# Patient Record
Sex: Female | Born: 1995 | Race: White | Hispanic: No | Marital: Married | Smoking: Never smoker
Health system: Southern US, Community
[De-identification: ages and names within clinical notes are randomized; demographics above are authoritative.]

## PROBLEM LIST (undated history)

## (undated) DIAGNOSIS — R091 Pleurisy: Secondary | ICD-10-CM

## (undated) DIAGNOSIS — K219 Gastro-esophageal reflux disease without esophagitis: Secondary | ICD-10-CM

## (undated) DIAGNOSIS — F909 Attention-deficit hyperactivity disorder, unspecified type: Secondary | ICD-10-CM

## (undated) HISTORY — DX: Attention-deficit hyperactivity disorder, unspecified type: F90.9

## (undated) HISTORY — PX: WISDOM TOOTH EXTRACTION: SHX21

---
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
## 2017-08-17 ENCOUNTER — Ambulatory Visit (HOSPITAL_COMMUNITY)
Admission: EM | Admit: 2017-08-17 | Discharge: 2017-08-17 | Disposition: A | Discharge status: Other Acute Inpatient Hospital | Payer: Self-pay

## 2017-08-17 ENCOUNTER — Emergency Department (HOSPITAL_COMMUNITY)
Admission: EM | Admit: 2017-08-17 | Discharge: 2017-08-17 | Discharge status: Against Medical Advice | Payer: Medicaid Other

## 2018-08-14 LAB — HM PAP SMEAR

## 2018-12-19 ENCOUNTER — Other Ambulatory Visit: Payer: Self-pay | Admitting: Pediatrics

## 2018-12-19 MED ORDER — KETOCONAZOLE 2 % EX CREA: 1.0000 "application " | TOPICAL_CREAM | Freq: Two times a day (BID) | CUTANEOUS | 0 refills | Status: DC

## 2018-12-20 ENCOUNTER — Other Ambulatory Visit: Payer: Self-pay | Admitting: Pediatrics

## 2018-12-31 ENCOUNTER — Encounter (INDEPENDENT_AMBULATORY_CARE_PROVIDER_SITE_OTHER): Payer: Self-pay

## 2018-12-31 ENCOUNTER — Telehealth: Payer: Self-pay

## 2018-12-31 ENCOUNTER — Telehealth: Payer: Medicaid Other | Admitting: Physician Assistant

## 2018-12-31 DIAGNOSIS — Z20822 Contact with and (suspected) exposure to covid-19: Secondary | ICD-10-CM

## 2018-12-31 NOTE — Progress Notes (Signed)
I have spent 5 minutes in review of e-visit questionnaire, review and updating patient chart, medical decision making and response to patient.   Evoleht Hovatter Cody Maybell Misenheimer, PA-C    

## 2018-12-31 NOTE — Progress Notes (Signed)
E-Visit for Corona Virus Screening   Your current symptoms could be consistent with the coronavirus.  Many health care providers can now test patients at their office but not all are.  [facility] has multiple testing sites. For information on our COVID testing locations and hours go to [email or web address]  Please quarantine yourself while awaiting your test results.  We are enrolling you in our MyChart Home Montioring for COVID19 . Daily you will receive a questionnaire within the MyChart website. Our COVID 19 response team willl be monitoriing your responses daily.    COVID-19 is a respiratory illness with symptoms that are similar to the flu. Symptoms are typically mild to moderate, but there have been cases of severe illness and death due to the virus. The following symptoms may appear 2-14 days after exposure: . Fever . Cough . Shortness of breath or difficulty breathing . Chills . Repeated shaking with chills . Muscle pain . Headache . Sore throat . New loss of taste or smell . Fatigue . Congestion or runny nose . Nausea or vomiting . Diarrhea  It is vitally important that if you feel that you have an infection such as this virus or any other virus that you stay home and away from places where you may spread it to others.  You should self-quarantine for 14 days if you have symptoms that could potentially be coronavirus or have been in close contact a with a person diagnosed with COVID-19 within the last 2 weeks. You should avoid contact with people age 65 and older.   You should wear a mask or cloth face covering over your nose and mouth if you must be around other people or animals, including pets (even at home). Try to stay at least 6 feet away from other people. This will protect the people around you.  You may also take acetaminophen (Tylenol) as needed for fever.   Reduce your risk of any infection by using the same precautions used for avoiding the  common cold or flu:  . Wash your hands often with soap and warm water for at least 20 seconds.  If soap and water are not readily available, use an alcohol-based hand sanitizer with at least 60% alcohol.  . If coughing or sneezing, cover your mouth and nose by coughing or sneezing into the elbow areas of your shirt or coat, into a tissue or into your sleeve (not your hands). . Avoid shaking hands with others and consider head nods or verbal greetings only. . Avoid touching your eyes, nose, or mouth with unwashed hands.  . Avoid close contact with people who are sick. . Avoid places or events with large numbers of people in one location, like concerts or sporting events. . Carefully consider travel plans you have or are making. . If you are planning any travel outside or inside the [place], visit the CDC's Travelers' Health webpage for the latest health notices. . If you have some symptoms but not all symptoms, continue to monitor at home and seek medical attention if your symptoms worsen. . If you are having a medical emergency, call 911.  HOME CARE . Only take medications as instructed by your medical team. . Drink plenty of fluids and get plenty of rest. . A steam or ultrasonic humidifier can help if you have congestion.   GET HELP RIGHT AWAY IF YOU HAVE EMERGENCY WARNING SIGNS** FOR COVID-19. If you or someone is showing any of these signs seek emergency medical care immediately. Call   911 or proceed to your closest emergency facility if: . You develop worsening high fever. . Trouble breathing . Bluish lips or face . Persistent pain or pressure in the chest . New confusion . Inability to wake or stay awake . You cough up blood. . Your symptoms become more severe  **This list is not all possible symptoms. Contact your medical provider for any symptoms that are sever or concerning to you.   MAKE SURE YOU   Understand these instructions.  Will watch your condition.  Will get help right  away if you are not doing well or get worse.  Your e-visit answers were reviewed by a board certified advanced clinical practitioner to complete your personal care plan.  Depending on the condition, your plan could have included both over the counter or prescription medications.  If there is a problem please reply once you have received a response from your provider.  Your safety is important to us.  If you have drug allergies check your prescription carefully.    You can use MyChart to ask questions about today's visit, request a non-urgent call back, or ask for a work or school excuse for 24 hours related to this e-Visit. If it has been greater than 24 hours you will need to follow up with your provider, or enter a new e-Visit to address those concerns. You will get an e-mail in the next two days asking about your experience.  I hope that your e-visit has been valuable and will speed your recovery. Thank you for using e-visits.    

## 2018-12-31 NOTE — Telephone Encounter (Signed)
Per MyChart questionnaire, pt indicated that her cough and weakness have worsened today. Pt stated she has frequent coughing with SOB with activity. Pt is producing yellow green phlegm. Pt stated that she is not having wheezing. Pt was febrile to 101.3 but responded to Ibuprofen. Pt has been taking OTC Nyquil, Tylenol, and Mucinex Max.  Pt is able to walk without holding onto furniture. Advised pt that is she get weak to the point she is having to old on to things to keep her from falling to go to ED. Advised pt to go to ED or [facility]. Advised pt that if she is SOB at rest, has difficulty drawing breath in  Advised to call 911.

## 2019-01-01 ENCOUNTER — Other Ambulatory Visit: Payer: Self-pay

## 2019-01-01 ENCOUNTER — Encounter (INDEPENDENT_AMBULATORY_CARE_PROVIDER_SITE_OTHER): Payer: Self-pay

## 2019-01-01 ENCOUNTER — Ambulatory Visit
Admission: EM | Admit: 2019-01-01 | Discharge: 2019-01-01 | Disposition: A | Discharge status: Home / Self Care | Payer: No Typology Code available for payment source | Attending: Emergency Medicine | Admitting: Emergency Medicine

## 2019-01-01 DIAGNOSIS — R0981 Nasal congestion: Secondary | ICD-10-CM | POA: Diagnosis not present

## 2019-01-01 DIAGNOSIS — J029 Acute pharyngitis, unspecified: Secondary | ICD-10-CM

## 2019-01-01 DIAGNOSIS — Z20828 Contact with and (suspected) exposure to other viral communicable diseases: Secondary | ICD-10-CM

## 2019-01-01 DIAGNOSIS — R05 Cough: Secondary | ICD-10-CM

## 2019-01-01 DIAGNOSIS — Z20822 Contact with and (suspected) exposure to covid-19: Secondary | ICD-10-CM

## 2019-01-01 DIAGNOSIS — R6889 Other general symptoms and signs: Secondary | ICD-10-CM

## 2019-01-01 MED ORDER — BENZONATATE 100 MG PO CAPS: 100.0000 mg | ORAL_CAPSULE | Freq: Three times a day (TID) | ORAL | 0 refills | Status: DC

## 2019-01-01 MED ORDER — FLUTICASONE PROPIONATE 50 MCG/ACT NA SUSP: 2.0000 | Freq: Every day | NASAL | 0 refills | Status: DC

## 2019-01-01 MED ORDER — CETIRIZINE-PSEUDOEPHEDRINE ER 5-120 MG PO TB12: 1.0000 | ORAL_TABLET | Freq: Every day | ORAL | 0 refills | Status: DC

## 2019-01-01 MED ORDER — OSELTAMIVIR PHOSPHATE 75 MG PO CAPS: 75.0000 mg | ORAL_CAPSULE | Freq: Two times a day (BID) | ORAL | 0 refills | Status: DC

## 2019-01-01 NOTE — ED Triage Notes (Signed)
Pt presents to UC w/ c/o sore throat, body aches, congestion, fever, diarrhea, sob w/ activity x3 days. Pt states she did get a covid test 3 days ago which was negative.

## 2019-01-01 NOTE — ED Provider Notes (Signed)
[facility]   [id number] 01/01/19 Arrival Time: 1145   CC: Flu-like symptoms   SUBJECTIVE: History from: patient.  Patricia Kane is a 23 y.o. female who presents congestion, sore throat, mild productive cough with green sputum, fever, with tmax of 101.7, body aches, chills, fatigue, diarrhea, and SOB with exertion x 3 days.  Denies sick exposure to COVID, flu or strep.  Denies recent travel.  However, works at [employer].  Has tried OTC tylenol, ibuprofen, nyquil, and throat lozenges with relief.  Denies aggravating factors.  Denies previous symptoms in the past.   Denies wheezing, chest pain, nausea, vomiting, changes in bladder habits.    Had negative COVID test 3 days ago.    ROS: As per HPI.  All other pertinent ROS negative.     Past Medical History:  Diagnosis Date  . GERD (gastroesophageal reflux disease)   . Pleurisy    Past Surgical History:  Procedure Laterality Date  . WISDOM TOOTH EXTRACTION     Allergies  Allergen Reactions  . Amoxicillin Swelling   No current facility-administered medications on file prior to encounter.    Current Outpatient Medications on File Prior to Encounter  Medication Sig Dispense Refill  . ibuprofen (ADVIL,MOTRIN) 600 MG tablet Take 1 tablet (600 mg total) by mouth every 6 (six) hours as needed for pain. 20 tablet 0  . [DISCONTINUED] omeprazole (PRILOSEC) 20 MG capsule Take 1 capsule (20 mg total) by mouth daily. 30 capsule 0   Social History   Socioeconomic History  . Marital status: Single    Spouse name: Not on file  . Number of children: Not on file  . Years of education: Not on file  . Highest education level: Not on file  Occupational History  . Not on file  Social Needs  . Financial resource strain: Not on file  . Food insecurity    Worry: Not on file    Inability: Not on file  . Transportation needs    Medical: Not on file    Non-medical: Not on file  Tobacco Use  . Smoking status: Never  Smoker  . Smokeless tobacco: Never Used  Substance and Sexual Activity  . Alcohol use: No  . Drug use: No  . Sexual activity: Yes    Birth control/protection: Implant  Lifestyle  . Physical activity    Days per week: Not on file    Minutes per session: Not on file  . Stress: Not on file  Relationships  . Social Musician on phone: Not on file    Gets together: Not on file    Attends religious service: Not on file    Active member of club or organization: Not on file    Attends meetings of clubs or organizations: Not on file    Relationship status: Not on file  . Intimate partner violence    Fear of current or ex partner: Not on file    Emotionally abused: Not on file    Physically abused: Not on file    Forced sexual activity: Not on file  Other Topics Concern  . Not on file  Social History Narrative  . Not on file   Family History  Problem Relation Age of Onset  . Healthy Mother   . Healthy Father     OBJECTIVE:  Vitals:   01/01/19 1215  BP: 117/75  Pulse: 93  Resp: 16  Temp: 98.3 F (36.8 C)  TempSrc: Oral  SpO2: 96%     General appearance: alert; appears mildly fatigued, but nontoxic; speaking in full sentences and tolerating own secretions HEENT: NCAT; Ears: EACs clear, TMs pearly gray; Eyes: PERRL.  EOM grossly intact. Nose: nares patent without rhinorrhea, turbinates swollen and mildly erythematous, Throat: oropharynx clear, tonsils non erythematous or enlarged, uvula midline  Neck: supple without LAD Lungs: unlabored respirations, symmetrical air entry; cough: mild; no respiratory distress; CTAB Heart: regular rate and rhythm.  Radial pulses 2+ symmetrical bilaterally Abdomen: soft, nondistended, normal active bowel sounds; nontender to palpation; no guarding  Skin: warm and dry Psychological: alert and cooperative; normal mood and affect  ASSESSMENT & PLAN:  1. Suspected COVID-19 virus infection   2. Flu-like symptoms     Meds ordered  this encounter  Medications  . cetirizine-pseudoephedrine (ZYRTEC-D) 5-120 MG tablet    Sig: Take 1 tablet by mouth daily.    Dispense:  30 tablet    Refill:  0    Order Specific Question:   Supervising Provider    Answer:   Raylene Everts [[id number]]  . fluticasone (FLONASE) 50 MCG/ACT nasal spray    Sig: Place 2 sprays into both nostrils daily.    Dispense:  16 g    Refill:  0    Order Specific Question:   Supervising Provider    Answer:   Raylene Everts [[id number]]  . benzonatate (TESSALON) 100 MG capsule    Sig: Take 1 capsule (100 mg total) by mouth every 8 (eight) hours.    Dispense:  21 capsule    Refill:  0    Order Specific Question:   Supervising Provider    Answer:   Raylene Everts [[id number]]  . oseltamivir (TAMIFLU) 75 MG capsule    Sig: Take 1 capsule (75 mg total) by mouth every 12 (twelve) hours.    Dispense:  10 capsule    Refill:  0    Order Specific Question:   Supervising Provider    Answer:   Raylene Everts [[id number]]   Did not retest, due to patient having a negative COVID test 3 days ago  You should remain isolated in your home for 10 days from symptom onset AND greater than 72 hours after symptoms resolution (absence of fever without the use of fever-reducing medication and improvement in respiratory symptoms), whichever is longer Get plenty of rest and push fluids Tessalon Perles prescribed for cough Zyrtec-D prescribed for nasal congestion, runny nose, and/or sore throat Flonase prescribed for nasal congestion and runny nose Tamiflu prescribed.  Take as directed and to completion Use OTC medications like ibuprofen or tylenol as needed fever or pain Follow up with PCP by phone or e-visit for recheck and to ensure your symptoms are improving Call or go to the ED if you have any new or worsening symptoms such as fever, worsening cough, shortness of breath, chest tightness, chest pain, turning blue, changes in mental status, etc...   Reviewed  expectations re: course of current medical issues. Questions answered. Outlined signs and symptoms indicating need for more acute intervention. Patient verbalized understanding. After Visit Summary given.         Lestine Box, PA-C 01/01/19 1529

## 2019-01-01 NOTE — Discharge Instructions (Signed)
COVID testing ordered.  It will take between 5-7 days for test results.  Someone will contact you regarding abnormal results.    In the meantime: You should remain isolated in your home for 10 days from symptom onset AND greater than 72 hours after symptoms resolution (absence of fever without the use of fever-reducing medication and improvement in respiratory symptoms), whichever is longer Get plenty of rest and push fluids Tessalon Perles prescribed for cough Zyrtec-D prescribed for nasal congestion, runny nose, and/or sore throat Flonase prescribed for nasal congestion and runny nose Tamiflu prescribed.  Take as directed and to completion Use OTC medications like ibuprofen or tylenol as needed fever or pain Follow up with PCP by phone or e-visit for recheck and to ensure your symptoms are improving Call or go to the ED if you have any new or worsening symptoms such as fever, worsening cough, shortness of breath, chest tightness, chest pain, turning blue, changes in mental status, etc..Marland Kitchen

## 2019-01-02 ENCOUNTER — Encounter (INDEPENDENT_AMBULATORY_CARE_PROVIDER_SITE_OTHER): Payer: Self-pay

## 2019-01-05 ENCOUNTER — Encounter (INDEPENDENT_AMBULATORY_CARE_PROVIDER_SITE_OTHER): Payer: Self-pay

## 2019-03-12 ENCOUNTER — Ambulatory Visit: Payer: No Typology Code available for payment source | Attending: Internal Medicine

## 2019-03-12 ENCOUNTER — Other Ambulatory Visit: Payer: Self-pay

## 2019-03-12 DIAGNOSIS — Z20822 Contact with and (suspected) exposure to covid-19: Secondary | ICD-10-CM

## 2019-03-14 LAB — NOVEL CORONAVIRUS, NAA: SARS-CoV-2, NAA: NOT DETECTED

## 2019-06-11 ENCOUNTER — Telehealth: Payer: Self-pay

## 2019-06-11 NOTE — Telephone Encounter (Signed)
Patient has no PCP. She states she was referred by NP Teodora Medici. Would like to establish care with you. Please advise.

## 2019-06-18 NOTE — Telephone Encounter (Signed)
Patient is scheduled   

## 2019-06-18 NOTE — Telephone Encounter (Signed)
Ok to establish 

## 2019-06-29 ENCOUNTER — Ambulatory Visit (INDEPENDENT_AMBULATORY_CARE_PROVIDER_SITE_OTHER): Payer: No Typology Code available for payment source | Admitting: Family Medicine

## 2019-06-29 ENCOUNTER — Encounter: Payer: Self-pay | Admitting: Family Medicine

## 2019-06-29 ENCOUNTER — Other Ambulatory Visit: Payer: Self-pay

## 2019-06-29 DIAGNOSIS — E010 Iodine-deficiency related diffuse (endemic) goiter: Secondary | ICD-10-CM | POA: Diagnosis not present

## 2019-06-29 DIAGNOSIS — Z3041 Encounter for surveillance of contraceptive pills: Secondary | ICD-10-CM | POA: Insufficient documentation

## 2019-06-29 DIAGNOSIS — E669 Obesity, unspecified: Secondary | ICD-10-CM | POA: Insufficient documentation

## 2019-06-29 DIAGNOSIS — Z3009 Encounter for other general counseling and advice on contraception: Secondary | ICD-10-CM | POA: Insufficient documentation

## 2019-06-29 MED ORDER — LESSINA 0.1-20 MG-MCG PO TABS: 1.0000 | ORAL_TABLET | Freq: Every day | ORAL | 3 refills | Status: DC

## 2019-06-29 NOTE — Assessment & Plan Note (Signed)
New to provider, ongoing for pt.  Surprisingly, [place] in 2018 was normal.  She has hx of 'borderline' labs.  Will check today and determine if intervention is needed.  Pt expressed understanding and is in agreement w/ plan.

## 2019-06-29 NOTE — Patient Instructions (Signed)
We'll determine follow up based on the lab results We'll notify you of your lab results and make any changes if needed Continue to work on healthy diet and regular exercise- you can do it! We can consider starting phentermine once we know what the thyroid is doing Your birth control is available for pickup/mail at the pharmacy Call with any questions or concerns Welcome!  We're glad to have you!

## 2019-06-29 NOTE — Assessment & Plan Note (Signed)
Pt is doing well on OCPs and would like to continue.  UTD on pap.  Refill provided.

## 2019-06-29 NOTE — Assessment & Plan Note (Signed)
New to provider, ongoing for pt.  She is exercising regularly, working on portion control, has eliminated most sodas, and making healthier food choices and has still only lost 4 lbs since January.  Check thyroid and tx if needed.  If labs WNL, will start Phentermine to improve metabolism.  Pt expressed understanding and is in agreement w/ plan.

## 2019-06-29 NOTE — Progress Notes (Signed)
   Subjective:    Patient ID: Patricia Kane, female    DOB: [birth year], 24 y.o.   MRN: [id number]  HPI New to establish.  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  Thyromegaly- pt had 'borderline labs'.  Had US done but is not aware of results- Epic results from 2018 show 'essentially normal'.  Was supposed to see Endo but had issues w/ insurance.  No neck TTP.  Obesity- ongoing issue for pt.  Has started walking regularly, drinking up to a gallon of water daily. Down to 3-4 sodas/week rather than multiple each day.  Attempting to make healthy food choices and portion control.  Pt reports that despite intensive efforts since January has only lost 4 lbs.  Pt feels she has always had 'a slow metabolism'.  Would be interested in medication if possible.  Birth control- pt is interested in me assuming her OCP prescription   Review of Systems For ROS see HPI   This visit occurred during the SARS-CoV-2 public health emergency.  Safety protocols were in place, including screening questions prior to the visit, additional usage of staff PPE, and extensive cleaning of exam room while observing appropriate contact time as indicated for disinfecting solutions.       Objective:   Physical Exam Vitals reviewed.  Constitutional:      General: She is not in acute distress.    Appearance: She is well-developed. She is obese.  HENT:     Head: Normocephalic and atraumatic.  Eyes:     Conjunctiva/sclera: Conjunctivae normal.     Pupils: Pupils are equal, round, and reactive to light.  Neck:     Thyroid: No thyromegaly.     Comments: Thyromegaly present Cardiovascular:     Rate and Rhythm: Normal rate and regular rhythm.     Heart sounds: Normal heart sounds. No murmur.  Pulmonary:     Effort: Pulmonary effort is normal. No respiratory distress.     Breath sounds: Normal breath sounds.  Abdominal:     General: There is no distension.     Palpations: Abdomen is soft.     Tenderness: There is  no abdominal tenderness.  Musculoskeletal:     Cervical back: Normal range of motion and neck supple.  Lymphadenopathy:     Cervical: No cervical adenopathy.  Skin:    General: Skin is warm and dry.  Neurological:     Mental Status: She is alert and oriented to person, place, and time.  Psychiatric:        Behavior: Behavior normal.           Assessment & Plan:

## 2019-06-30 LAB — BASIC METABOLIC PANEL
BUN: 7 mg/dL (ref 7–25)
CO2: 25 mmol/L (ref 20–32)
Calcium: 9.8 mg/dL (ref 8.6–10.2)
Chloride: 104 mmol/L (ref 98–110)
Creat: 0.74 mg/dL (ref 0.50–1.10)
Glucose, Bld: 78 mg/dL (ref 65–99)
Potassium: 4.4 mmol/L (ref 3.5–5.3)
Sodium: 139 mmol/L (ref 135–146)

## 2019-06-30 LAB — CBC WITH DIFFERENTIAL/PLATELET
Absolute Monocytes: 474 cells/uL (ref 200–950)
Basophils Absolute: 41 cells/uL (ref 0–200)
Basophils Relative: 0.4 %
Eosinophils Absolute: 62 cells/uL (ref 15–500)
Eosinophils Relative: 0.6 %
HCT: 39.5 % (ref 35.0–45.0)
Hemoglobin: 13 g/dL (ref 11.7–15.5)
Lymphs Abs: 2802 cells/uL (ref 850–3900)
MCH: 29.4 pg (ref 27.0–33.0)
MCHC: 32.9 g/dL (ref 32.0–36.0)
MCV: 89.4 fL (ref 80.0–100.0)
MPV: 11.9 fL (ref 7.5–12.5)
Monocytes Relative: 4.6 %
Neutro Abs: 6922 cells/uL (ref 1500–7800)
Neutrophils Relative %: 67.2 %
Platelets: 273 10*3/uL (ref 140–400)
RBC: 4.42 10*6/uL (ref 3.80–5.10)
RDW: 12.7 % (ref 11.0–15.0)
Total Lymphocyte: 27.2 %
WBC: 10.3 10*3/uL (ref 3.8–10.8)

## 2019-06-30 LAB — HEPATIC FUNCTION PANEL
AG Ratio: 1.6 (calc) (ref 1.0–2.5)
ALT: 7 U/L (ref 6–29)
AST: 14 U/L (ref 10–30)
Albumin: 4.4 g/dL (ref 3.6–5.1)
Alkaline phosphatase (APISO): 60 U/L (ref 31–125)
Bilirubin, Direct: 0 mg/dL (ref 0.0–0.2)
Globulin: 2.8 g/dL (ref 1.9–3.7)
Indirect Bilirubin: 0.3 mg/dL (ref 0.2–1.2)
Total Bilirubin: 0.3 mg/dL (ref 0.2–1.2)
Total Protein: 7.2 g/dL (ref 6.1–8.1)

## 2019-06-30 LAB — LIPID PANEL
Cholesterol: 151 mg/dL (ref <200)
HDL: 44 mg/dL — ABNORMAL LOW (ref >=50)
LDL Cholesterol (Calc): 87 mg/dL (calc)
Non-HDL Cholesterol (Calc): 107 mg/dL (calc) (ref <130)
Total CHOL/HDL Ratio: 3.4 (calc) (ref <5.0)
Triglycerides: 104 mg/dL (ref <150)

## 2019-06-30 LAB — T4, FREE: Free T4: 1.2 ng/dL (ref 0.8–1.8)

## 2019-06-30 LAB — T3, FREE: T3, Free: 3 pg/mL (ref 2.3–4.2)

## 2019-06-30 LAB — TSH: TSH: 1.15 m[IU]/L

## 2019-07-02 ENCOUNTER — Encounter: Payer: Self-pay | Admitting: Family Medicine

## 2019-07-03 MED ORDER — PHENTERMINE HCL 37.5 MG PO CAPS: 37.5000 mg | ORAL_CAPSULE | ORAL | 0 refills | Status: DC

## 2019-07-27 ENCOUNTER — Encounter (INDEPENDENT_AMBULATORY_CARE_PROVIDER_SITE_OTHER): Payer: No Typology Code available for payment source | Admitting: Family Medicine

## 2019-07-27 DIAGNOSIS — B3731 Acute candidiasis of vulva and vagina: Secondary | ICD-10-CM

## 2019-07-27 DIAGNOSIS — B373 Candidiasis of vulva and vagina: Secondary | ICD-10-CM

## 2019-07-27 MED ORDER — FLUCONAZOLE 150 MG PO TABS: 150.0000 mg | ORAL_TABLET | Freq: Once | ORAL | 0 refills | Status: AC

## 2019-07-27 NOTE — Telephone Encounter (Signed)
Cumulative Time: 5 minutes Consent: Pt reached out via MyChart and gave consent People Involved: pt, CMA (Jess B), myself CC: yeast infection HPI: pt reports 5 days of vaginal redness, white discharge, itching.  No relief w/ OTC Miconazole.  She has a hx of yeast infxns and feels this is similar. AP: yeast vaginitis.  Start Diflucan.

## 2019-10-04 ENCOUNTER — Encounter: Payer: Self-pay | Admitting: Family Medicine

## 2019-10-05 ENCOUNTER — Encounter: Payer: Self-pay | Admitting: General Practice

## 2019-10-05 NOTE — Telephone Encounter (Signed)
This is only meant to be used for 3 months and then take a break so that your metabolism doesn't become dependent on it.  No refills at this time

## 2019-10-05 NOTE — Telephone Encounter (Signed)
Last OV 06/29/19 Phentermine last filled 07/03/19 #90 with 0

## 2019-10-11 ENCOUNTER — Telehealth: Payer: No Typology Code available for payment source | Admitting: Nurse Practitioner

## 2019-10-11 DIAGNOSIS — J069 Acute upper respiratory infection, unspecified: Secondary | ICD-10-CM | POA: Diagnosis not present

## 2019-10-11 MED ORDER — BENZONATATE 100 MG PO CAPS: 100.0000 mg | ORAL_CAPSULE | Freq: Three times a day (TID) | ORAL | 0 refills | Status: DC | PRN

## 2019-10-11 MED ORDER — FLUTICASONE PROPIONATE 50 MCG/ACT NA SUSP: 2.0000 | Freq: Every day | NASAL | 6 refills | Status: DC

## 2019-10-11 NOTE — Progress Notes (Signed)

## 2019-10-13 ENCOUNTER — Telehealth: Payer: No Typology Code available for payment source | Admitting: Nurse Practitioner

## 2019-10-13 ENCOUNTER — Encounter: Payer: Self-pay | Admitting: Emergency Medicine

## 2019-10-13 ENCOUNTER — Ambulatory Visit
Admission: EM | Admit: 2019-10-13 | Discharge: 2019-10-13 | Disposition: A | Discharge status: Home / Self Care | Payer: No Typology Code available for payment source | Attending: Physician Assistant | Admitting: Physician Assistant

## 2019-10-13 DIAGNOSIS — Z20822 Contact with and (suspected) exposure to covid-19: Secondary | ICD-10-CM

## 2019-10-13 DIAGNOSIS — R509 Fever, unspecified: Secondary | ICD-10-CM

## 2019-10-13 DIAGNOSIS — R059 Cough, unspecified: Secondary | ICD-10-CM

## 2019-10-13 DIAGNOSIS — B9789 Other viral agents as the cause of diseases classified elsewhere: Secondary | ICD-10-CM

## 2019-10-13 DIAGNOSIS — R52 Pain, unspecified: Secondary | ICD-10-CM

## 2019-10-13 DIAGNOSIS — J988 Other specified respiratory disorders: Secondary | ICD-10-CM

## 2019-10-13 DIAGNOSIS — R5081 Fever presenting with conditions classified elsewhere: Secondary | ICD-10-CM

## 2019-10-13 NOTE — ED Triage Notes (Signed)
Fever, body aches, chills, sneezing and congestion x 3-4 days

## 2019-10-13 NOTE — Progress Notes (Signed)
E-Visit for Corona Virus Screening  Your current symptoms could be consistent with the coronavirus.  Many health care providers can now test patients at their office but not all are.  Patricia Kane has multiple testing sites. For information on our COVID testing locations and hours go to [email or web address]  We are enrolling you in our MyChart Home Monitoring for COVID19 . Daily you will receive a questionnaire within the MyChart website. Our COVID 19 response team will be monitoring your responses daily.  Testing Information: The COVID-19 Community Testing sites will begin testing BY APPOINTMENT ONLY.  You can schedule online at [email or web address]  If you do not have access to a smart phone or computer you may call [phone] for an appointment.   Additional testing sites in the Community:  . For [facility] Testing sites in [facility]  [email or web address].[email or web address]  . For Pop-up testing sites in [place]  [email or web address]  . For Testing sites with regular hours [email or web address]  . For [facility] [email or web address]  . For [facility] [email or web address].dk  . For [facility] testing in [place] and [employer] [email or web address].dk  . For [facility] testing in [facility]   [email or web address]  For  more information about community testing call [phone]   Please quarantine yourself while awaiting your test results. Please stay home for a minimum of 10 days from the first day of illness with improving symptoms and you have had 24 hours of no fever (without the use of Tylenol (Acetaminophen)  Motrin (Ibuprofen) or any fever reducing medication).  Also - Do not get tested prior to returning to work because once you have had a positive test the test can stay positive for more then a month in some cases.   You should wear a mask or cloth face covering over your nose and mouth if you must be around other people or animals, including pets (even at home). Try to stay at least 6 feet away from other people. This will protect the people around you.  Please continue good preventive care measures, including:  frequent hand-washing, avoid touching your face, cover coughs/sneezes, stay out of crowds and keep a 6 foot distance from others.  COVID-19 is a respiratory illness with symptoms that are similar to the flu. Symptoms are typically mild to moderate, but there have been cases of severe illness and death due to the virus.   The following symptoms may appear 2-14 days after exposure: . Fever . Cough . Shortness of breath or difficulty breathing . Chills . Repeated shaking with chills . Muscle pain . Headache . Sore throat . New loss of taste or smell . Fatigue . Congestion or runny nose . Nausea or vomiting . Diarrhea  Go to the nearest hospital ED for assessment if fever/cough/breathlessness are severe or illness seems like a threat to life.  It is vitally important that if you feel that you have an infection such as this virus or any other virus that you stay home and away from places where you may spread it to others.  You should avoid contact with people age 95 and older.   You can use medication such as A prescription cough medication called Tessalon Perles 100 mg. You may take 1-2 capsules every 8 hours as needed for cough. This was called in 2 days ago for you cough.  You may also take acetaminophen (Tylenol) as needed for fever.  Reduce your risk of any infection by using the same precautions used for avoiding  the common cold or flu:  Marland Kitchen Wash your hands often with soap and warm  water for at least 20 seconds.  If soap and water are not readily available, use an alcohol-based hand sanitizer with at least 60% alcohol.  . If coughing or sneezing, cover your mouth and nose by coughing or sneezing into the elbow areas of your shirt or coat, into a tissue or into your sleeve (not your hands). . Avoid shaking hands with others and consider head nods or verbal greetings only. . Avoid touching your eyes, nose, or mouth with unwashed hands.  . Avoid close contact with people who are sick. . Avoid places or events with large numbers of people in one location, like concerts or sporting events. . Carefully consider travel plans you have or are making. . If you are planning any travel outside or inside the [place], visit the CDC's Travelers' Health webpage for the latest health notices. . If you have some symptoms but not all symptoms, continue to monitor at home and seek medical attention if your symptoms worsen. . If you are having a medical emergency, call 911.  HOME CARE . Only take medications as instructed by your medical team. . Drink plenty of fluids and get plenty of rest. . A steam or ultrasonic humidifier can help if you have congestion.   GET HELP RIGHT AWAY IF YOU HAVE EMERGENCY WARNING SIGNS** FOR COVID-19. If you or someone is showing any of these signs seek emergency medical care immediately. Call 911 or proceed to your closest emergency facility if: . You develop worsening high fever. . Trouble breathing . Bluish lips or face . Persistent pain or pressure in the chest . New confusion . Inability to wake or stay awake . You cough up blood. . Your symptoms become more severe  **This list is not all possible symptoms. Contact your medical provider for any symptoms that are sever or concerning to you.  MAKE SURE YOU   Understand these instructions.  Will watch your condition.  Will get help right away if you are not doing well or get worse.  Your e-visit  answers were reviewed by a board certified advanced clinical practitioner to complete your personal care plan.  Depending on the condition, your plan could have included both over the counter or prescription medications.  If there is a problem please reply once you have received a response from your provider.  Your safety is important to [place].  If you have drug allergies check your prescription carefully.    You can use MyChart to ask questions about today's visit, request a non-urgent call back, or ask for a work or school excuse for 24 hours related to this e-Visit. If it has been greater than 24 hours you will need to follow up with your provider, or enter a new e-Visit to address those concerns. You will get an e-mail in the next two days asking about your experience.  I hope that your e-visit has been valuable and will speed your recovery. Thank you for using e-visits.   5-10 minutes spent reviewing and documenting in chart.

## 2019-10-13 NOTE — Discharge Instructions (Addendum)
No evidence of a bacterial infection. Treat with Motrin, rest and hydration. FU if worsens. Will call with covid testing.

## 2019-10-13 NOTE — ED Provider Notes (Signed)
[facility]    CSN: [id number] Arrival date & time: 10/13/19  1245      History   Chief Complaint Chief Complaint  Patient presents with   Generalized Body Aches    HPI Patricia Kane is a 24 y.o. female.   With a 4 day history of malaise, fever, body aches and congestion. 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 She does have a non-productive cough as well.      Past Medical History:  Diagnosis Date   GERD (gastroesophageal reflux disease)    Pleurisy     Patient Active Problem List   Diagnosis Date Noted   Thyromegaly 06/29/2019   Obesity (BMI 30-39.9) 06/29/2019   Encounter for birth control pills maintenance 06/29/2019   GANGLION-HAND/WRIST 11/05/2009    Past Surgical History:  Procedure Laterality Date   WISDOM TOOTH EXTRACTION      OB History   No obstetric history on file.      Home Medications    Prior to Admission medications   Medication Sig Start Date End Date Taking? Authorizing Provider  benzonatate (TESSALON PERLES) 100 MG capsule Take 1 capsule (100 mg total) by mouth 3 (three) times daily as needed. 10/11/19   Daphine Deutscher, Mary-Margaret, FNP  cetirizine-pseudoephedrine (ZYRTEC-D) 5-120 MG tablet Take 1 tablet by mouth daily. 01/01/19   Wurst, [place], PA-C  fluticasone (FLONASE) 50 MCG/ACT nasal spray Place 2 sprays into both nostrils daily. 10/11/19   Daphine Deutscher, Mary-Margaret, FNP  ibuprofen (ADVIL,MOTRIN) 600 MG tablet Take 1 tablet (600 mg total) by mouth every 6 (six) hours as needed for pain. 10/05/12   Burgess Amor, PA-C  LESSINA-28 0.1-20 MG-MCG tablet Take 1 tablet by mouth daily. 06/29/19   Sheliah Hatch, MD  phentermine 37.5 MG capsule Take 1 capsule (37.5 mg total) by mouth every morning. 07/03/19   Sheliah Hatch, MD  omeprazole (PRILOSEC) 20 MG capsule Take 1 capsule (20 mg total) by mouth daily. 07/11/16 01/01/19  Glynn Octave, MD    Family History Family History  Problem  Relation Age of Onset   Healthy Mother    Healthy Father    Asthma Maternal Grandfather    COPD Maternal Grandfather    Heart attack Maternal Grandfather     Social History Social History   Tobacco Use   Smoking status: Never Smoker   Smokeless tobacco: Never Used  Building services engineer Use: Never used  Substance Use Topics   Alcohol use: No   Drug use: No     Allergies   Amoxicillin and Pineapple   Review of Systems Review of Systems  Constitutional: Positive for fatigue and fever.  HENT: Positive for congestion. Negative for sinus pressure and sinus pain.   Respiratory: Positive for cough. Negative for shortness of breath and wheezing.   Skin: Negative.      Physical Exam Triage Vital Signs ED Triage Vitals  Enc Vitals Group     BP 10/13/19 1317 115/78     Pulse Rate 10/13/19 1317 85     Resp 10/13/19 1317 16     Temp 10/13/19 1317 97.8 F (36.6 C)     Temp Source 10/13/19 1317 Oral     SpO2 10/13/19 1317 95 %     Weight --      Height --      Head Circumference --      Peak Flow --      Pain Score 10/13/19 1323  4     Pain Loc --      Pain Edu? --      Excl. in GC? --    No data found.  Updated Vital Signs BP 115/78 (BP Location: Right Arm)    Pulse 85    Temp 97.8 F (36.6 C) (Oral)    Resp 16    SpO2 95%   Visual Acuity Right Eye Distance:   Left Eye Distance:   Bilateral Distance:    Right Eye Near:   Left Eye Near:    Bilateral Near:     Physical Exam Vitals and nursing note reviewed.  Constitutional:      General: She is not in acute distress.    Appearance: Normal appearance. She is not ill-appearing.  HENT:     Head: Normocephalic and atraumatic.     Right Ear: Tympanic membrane normal.     Left Ear: Tympanic membrane normal.     Nose: Nose normal.     Mouth/Throat:     Mouth: Mucous membranes are moist.     Pharynx: Oropharynx is clear.  Cardiovascular:     Rate and Rhythm: Normal rate and regular rhythm.    Pulmonary:     Effort: Pulmonary effort is normal.     Breath sounds: Normal breath sounds. No stridor. No wheezing or rhonchi.  Skin:    General: Skin is warm and dry.     Findings: No rash.  Neurological:     General: No focal deficit present.     Mental Status: She is alert.  Psychiatric:        Mood and Affect: Mood normal.      UC Treatments / Results  Labs (all labs ordered are listed, but only abnormal results are displayed) Labs Reviewed - No data to display  EKG   Radiology No results found.  Procedures Procedures (including critical care time)  Medications Ordered in UC Medications - No data to display  Initial Impression / Assessment and Plan / UC Course  I have reviewed the triage vital signs and the nursing notes.  Pertinent labs & imaging results that were available during my care of the patient were reviewed by me and considered in my medical decision making (see chart for details).     Covid testing, treat symptomatically for viral illness. FU if worsens.  Final Clinical Impressions(s) / UC Diagnoses   Final diagnoses:  None   Discharge Instructions   None    ED Prescriptions    None     PDMP not reviewed this encounter.   Riki Sheer, [place] 10/13/19 1335

## 2019-10-14 LAB — SARS-COV-2, NAA 2 DAY TAT

## 2019-10-14 LAB — NOVEL CORONAVIRUS, NAA: SARS-CoV-2, NAA: NOT DETECTED

## 2019-11-21 ENCOUNTER — Ambulatory Visit (INDEPENDENT_AMBULATORY_CARE_PROVIDER_SITE_OTHER): Payer: No Typology Code available for payment source | Admitting: Family Medicine

## 2019-11-21 ENCOUNTER — Encounter: Payer: Self-pay | Admitting: Family Medicine

## 2019-11-21 ENCOUNTER — Other Ambulatory Visit: Payer: Self-pay

## 2019-11-21 VITALS — BP 118/78 | HR 92 | Temp 97.9°F | Resp 16 | Ht 65.0 in | Wt 195.5 lb

## 2019-11-21 DIAGNOSIS — E669 Obesity, unspecified: Secondary | ICD-10-CM | POA: Diagnosis not present

## 2019-11-21 DIAGNOSIS — M26621 Arthralgia of right temporomandibular joint: Secondary | ICD-10-CM | POA: Diagnosis not present

## 2019-11-21 DIAGNOSIS — F900 Attention-deficit hyperactivity disorder, predominantly inattentive type: Secondary | ICD-10-CM | POA: Insufficient documentation

## 2019-11-21 MED ORDER — PHENTERMINE HCL 37.5 MG PO CAPS: 37.5000 mg | ORAL_CAPSULE | ORAL | 0 refills | Status: DC

## 2019-11-21 NOTE — Assessment & Plan Note (Signed)
Pt is down 26 lbs since last visit.  Applauded her efforts.  She feels that in the 2 months she has been off phentermine, she has plateaued in her weight loss.  Asking to restart.  Confirmed that she has, in fact, changed her diet and started excising.  Congratulated her on these changes.  Prescription sent.

## 2019-11-21 NOTE — Assessment & Plan Note (Addendum)
New.  Pt reports taking the Phentermine made a big difference in her ability to focus, prioritize, and get work done.  Looking back, she feels that she may have always had an issue with attention but this was not diagnosed.  Since she is doing another round of the phentermine we will hold off on starting Concerta at this time but will revisit this in the future.  Pt expressed understanding and is in agreement w/ plan.

## 2019-11-21 NOTE — Patient Instructions (Addendum)
Follow up in 3 months to assess weight loss and discuss attention RESTART the Phentermine daily We'll call you with your Dental Appt Ibuprofen as needed for jaw pain Try and be aware of stress and avoid clenching your jaw Keep up the good work on healthy diet and regular exercise- you're doing great! Call with any questions or concerns Stay Safe!  Stay Healthy!

## 2019-11-21 NOTE — Progress Notes (Signed)
° °  Subjective:    Patient ID: Patricia Kane, female    DOB: 1995-09-10, 24 y.o.   MRN: [id number]  HPI Obesity- pt is down 26 lbs since last visit.  BMI is now 32.53  Was previously on Phentermine.  Pt reports feeling good.  Finished meds end of July and feels she has plateaued.  Continues to exercise- walking.  Was able to make dietary changes and has stuck with these.  No CP, SOB, HAs, abd pain, N/V.  Possible ADHD- pt reports there was a big difference in her ability to focus when she was taking the phentermine.  She reports she was able to focus, get her tasks done, work performance was better.  She is now having to write things down and struggles to complete tasks.  Currently working and in school.  Having a hard time prioritizing.  Pt thinks back to school and realizes she has always had difficulty w/ focus.  Jaw pain- R sided, will come and go.  Difficulty opening mouth wide.  Pt reports she will clench jaw when stressed.  Interested in possible referral for jaw assessment.     Review of Systems For ROS see HPI   This visit occurred during the SARS-CoV-2 public health emergency.  Safety protocols were in place, including screening questions prior to the visit, additional usage of staff PPE, and extensive cleaning of exam room while observing appropriate contact time as indicated for disinfecting solutions.       Objective:   Physical Exam Vitals reviewed.  Constitutional:      General: She is not in acute distress.    Appearance: Normal appearance. She is well-developed. She is obese.  HENT:     Head: Normocephalic and atraumatic.     Mouth/Throat:     Comments: Full ROM of jaw bilaterally Eyes:     Conjunctiva/sclera: Conjunctivae normal.     Pupils: Pupils are equal, round, and reactive to light.  Neck:     Thyroid: No thyromegaly.  Cardiovascular:     Rate and Rhythm: Normal rate and regular rhythm.     Heart sounds: Normal heart sounds. No murmur heard.   Pulmonary:      Effort: Pulmonary effort is normal. No respiratory distress.     Breath sounds: Normal breath sounds.  Abdominal:     General: There is no distension.     Palpations: Abdomen is soft.     Tenderness: There is no abdominal tenderness.  Musculoskeletal:     Cervical back: Normal range of motion and neck supple.  Lymphadenopathy:     Cervical: No cervical adenopathy.  Skin:    General: Skin is warm and dry.  Neurological:     Mental Status: She is alert and oriented to person, place, and time.  Psychiatric:        Behavior: Behavior normal.           Assessment & Plan:  R sided TMJ- new.  Discussed clenching teeth, treating pain w/ ibuprofen, and dental referral.  Pt expressed understanding and is in agreement w/ plan.

## 2019-12-10 ENCOUNTER — Emergency Department: Payer: No Typology Code available for payment source

## 2019-12-10 ENCOUNTER — Encounter: Payer: Self-pay | Admitting: Emergency Medicine

## 2019-12-10 ENCOUNTER — Emergency Department
Admission: EM | Admit: 2019-12-10 | Discharge: 2019-12-10 | Disposition: A | Discharge status: Home / Self Care | Payer: No Typology Code available for payment source | Attending: Emergency Medicine | Admitting: Emergency Medicine

## 2019-12-10 ENCOUNTER — Other Ambulatory Visit: Payer: Self-pay

## 2019-12-10 DIAGNOSIS — S161XXA Strain of muscle, fascia and tendon at neck level, initial encounter: Secondary | ICD-10-CM | POA: Diagnosis not present

## 2019-12-10 DIAGNOSIS — Y9389 Activity, other specified: Secondary | ICD-10-CM | POA: Insufficient documentation

## 2019-12-10 DIAGNOSIS — G44309 Post-traumatic headache, unspecified, not intractable: Secondary | ICD-10-CM | POA: Insufficient documentation

## 2019-12-10 DIAGNOSIS — R0789 Other chest pain: Secondary | ICD-10-CM | POA: Diagnosis not present

## 2019-12-10 DIAGNOSIS — S39012A Strain of muscle, fascia and tendon of lower back, initial encounter: Secondary | ICD-10-CM | POA: Insufficient documentation

## 2019-12-10 DIAGNOSIS — Y92410 Unspecified street and highway as the place of occurrence of the external cause: Secondary | ICD-10-CM | POA: Insufficient documentation

## 2019-12-10 DIAGNOSIS — S3992XA Unspecified injury of lower back, initial encounter: Secondary | ICD-10-CM | POA: Diagnosis present

## 2019-12-10 LAB — POCT PREGNANCY, URINE: Preg Test, Ur: NEGATIVE

## 2019-12-10 MED ORDER — METHOCARBAMOL 750 MG PO TABS
750.0000 mg | ORAL_TABLET | Freq: Four times a day (QID) | ORAL | 0 refills | Status: AC | PRN
Start: 2019-12-10 — End: 2019-12-20

## 2019-12-10 MED ORDER — MELOXICAM 15 MG PO TABS
15.0000 mg | ORAL_TABLET | Freq: Every day | ORAL | 0 refills | Status: DC
Start: 2019-12-10 — End: 2019-12-12
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
## 2019-12-10 NOTE — Discharge Instructions (Addendum)
Please use the muscle relaxer and anti-inflammatory as prescribed.  You may also use Tylenol in addition to this for pain control.  Please follow-up with your primary care should you have any continuing pain from this.  Return to the emergency department for any worsening symptoms.

## 2019-12-12 ENCOUNTER — Encounter: Payer: Self-pay | Admitting: Family Medicine

## 2019-12-12 ENCOUNTER — Inpatient Hospital Stay: Payer: No Typology Code available for payment source | Admitting: Family Medicine

## 2019-12-12 ENCOUNTER — Ambulatory Visit (INDEPENDENT_AMBULATORY_CARE_PROVIDER_SITE_OTHER): Payer: No Typology Code available for payment source | Admitting: Family Medicine

## 2019-12-12 ENCOUNTER — Other Ambulatory Visit: Payer: Self-pay

## 2019-12-12 VITALS — BP 124/86 | HR 92 | Temp 97.9°F | Resp 16 | Wt 192.1 lb

## 2019-12-12 DIAGNOSIS — M542 Cervicalgia: Secondary | ICD-10-CM

## 2019-12-12 DIAGNOSIS — F411 Generalized anxiety disorder: Secondary | ICD-10-CM

## 2019-12-12 DIAGNOSIS — M546 Pain in thoracic spine: Secondary | ICD-10-CM | POA: Diagnosis not present

## 2019-12-12 MED ORDER — PREDNISONE 10 MG PO TABS
ORAL_TABLET | ORAL | 0 refills | Status: DC
Start: 2019-12-12 — End: 2019-12-26

## 2019-12-12 MED ORDER — BUSPIRONE HCL 7.5 MG PO TABS: 7.5000 mg | ORAL_TABLET | Freq: Two times a day (BID) | ORAL | 1 refills | Status: DC

## 2019-12-12 MED ORDER — LESSINA 0.1-20 MG-MCG PO TABS: 1.0000 | ORAL_TABLET | Freq: Every day | ORAL | 3 refills | Status: DC
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
## 2019-12-12 NOTE — Patient Instructions (Signed)
Follow up as needed or as scheduled STOP the Meloxicam START the Prednisone as directed- take w/ food USE the Methocarbamol as needed for spasm HEAT! Start the Buspirone twice daily to help w/ anxiety Call with any questions or concerns Hang in there!

## 2019-12-26 ENCOUNTER — Encounter: Payer: Self-pay | Admitting: Family Medicine

## 2019-12-26 ENCOUNTER — Other Ambulatory Visit: Payer: Self-pay

## 2019-12-26 ENCOUNTER — Ambulatory Visit (INDEPENDENT_AMBULATORY_CARE_PROVIDER_SITE_OTHER): Payer: Self-pay | Admitting: Family Medicine

## 2019-12-26 VITALS — BP 112/81 | HR 80 | Temp 98.2°F | Resp 16 | Ht 65.0 in | Wt 187.1 lb

## 2019-12-26 DIAGNOSIS — F411 Generalized anxiety disorder: Secondary | ICD-10-CM

## 2019-12-26 DIAGNOSIS — M542 Cervicalgia: Secondary | ICD-10-CM

## 2019-12-26 DIAGNOSIS — M546 Pain in thoracic spine: Secondary | ICD-10-CM

## 2019-12-26 MED ORDER — CYCLOBENZAPRINE HCL 10 MG PO TABS
10.0000 mg | ORAL_TABLET | Freq: Three times a day (TID) | ORAL | 0 refills | Status: DC | PRN
Start: 2019-12-26 — End: 2020-04-11

## 2019-12-26 MED ORDER — MELOXICAM 15 MG PO TABS: 15.0000 mg | ORAL_TABLET | Freq: Every day | ORAL | 0 refills | Status: DC

## 2019-12-26 NOTE — Patient Instructions (Signed)
Follow up as needed or as scheduled We'll call you with your physical therapy appt RESTART Meloxicam once daily- take w/ food Do not use any additional ibuprofen, motrin, aleve, etc but you can you Tylenol for breakthrough pain (acetaminophen) USE the cyclobenzaprine (Flexeril) as needed for spasm Continue to heat Call with any questions or concerns Hang in there!

## 2019-12-26 NOTE — Progress Notes (Signed)
   Subjective:    Patient ID: Patricia Kane, female    DOB: 1995-11-18, 24 y.o.   MRN: [id number]  HPI Musculoskeletal pain- pt continues to have R sided neck pain and lower back pain.  LBP is midline.  Pain will radiate upward.  No numbness/tingling.  No bowel or bladder incontinence.  Completed course of Prednisone.  Continues to take Methocarbamol.  This improves neck pain but not lower back pain.  Pain is 'pretty constant'.  Worsens w/ increased activity, standing.  Heat is helpful.  Anxiety- improving w/ Buspar.   Review of Systems For ROS see HPI   This visit occurred during the SARS-CoV-2 public health emergency.  Safety protocols were in place, including screening questions prior to the visit, additional usage of staff PPE, and extensive cleaning of exam room while observing appropriate contact time as indicated for disinfecting solutions.       Objective:   Physical Exam Vitals reviewed.  Constitutional:      General: She is not in acute distress.    Appearance: Normal appearance. She is not ill-appearing.  HENT:     Head: Normocephalic and atraumatic.  Eyes:     Extraocular Movements: Extraocular movements intact.     Pupils: Pupils are equal, round, and reactive to light.  Musculoskeletal:        General: Tenderness (TTP over thoracic and upper lumbar spine.  TTP over R paraspinal muscles) present.     Cervical back: Normal range of motion. Tenderness (TTP over R trap spasm) present.  Neurological:     General: No focal deficit present.     Mental Status: She is alert and oriented to person, place, and time.     Cranial Nerves: No cranial nerve deficit.     Motor: No weakness.     Gait: Gait normal.     Comments: (-) SLR bilaterally           Assessment & Plan:  Neck pain- ongoing.  Consistent w/ trap spasm.  Some improvement w/ prednisone, heat, robaxin.  Will switch to Flexeril and daily Meloxicam and monitor for improvement.  Will refer to PT.  Thoracic  back pain- ongoing.  This is most bothersome for pt.  Minimal relief w/ prednisone.  Start daily Meloxicam and Flexeril at night.  Refer to PT  Anxiety rxn- improved since starting Buspar.

## 2020-01-17 ENCOUNTER — Ambulatory Visit (HOSPITAL_COMMUNITY): Payer: Medicaid Other | Admitting: Physical Therapy

## 2020-01-22 ENCOUNTER — Ambulatory Visit (HOSPITAL_COMMUNITY): Payer: Medicaid Other | Attending: Family Medicine | Admitting: Physical Therapy

## 2020-01-27 ENCOUNTER — Encounter: Payer: Self-pay | Admitting: Family Medicine

## 2020-02-22 ENCOUNTER — Ambulatory Visit: Payer: No Typology Code available for payment source | Admitting: Family Medicine

## 2020-03-04 ENCOUNTER — Telehealth (INDEPENDENT_AMBULATORY_CARE_PROVIDER_SITE_OTHER): Payer: No Typology Code available for payment source | Admitting: Family Medicine

## 2020-03-04 ENCOUNTER — Encounter: Payer: Self-pay | Admitting: Family Medicine

## 2020-03-04 DIAGNOSIS — U071 COVID-19: Secondary | ICD-10-CM

## 2020-03-04 NOTE — Progress Notes (Signed)
I connected with  Patricia Kane on 03/04/20 by a video enabled telemedicine application and verified that I am speaking with the correct person using two identifiers.   I discussed the limitations of evaluation and management by telemedicine. The patient expressed understanding and agreed to proceed.

## 2020-03-04 NOTE — Progress Notes (Signed)
   Virtual Visit via Video   I connected with patient on 03/04/20 at 10:00 AM EST by a video enabled telemedicine application and verified that I am speaking with the correct person using two identifiers.  Location patient: Home Location provider: [facility] Persons participating in the virtual visit: Patient, Provider, CMA (Sabrina M)  I discussed the limitations of evaluation and management by telemedicine and the availability of in person appointments. The patient expressed understanding and agreed to proceed.  Subjective:   HPI:   COVID sxs- sxs started 1 week ago w/ runny nose and sore throat.  Had home test and it was + and then had 2 negative tests.  On Friday had 2 + rapid tests.  Saturday developed body aches, cold sweats, cough.  Tm 99.9  Pt is fully vaccinated.  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  ROS:   See pertinent positives and negatives per HPI.  Patient Active Problem List   Diagnosis Date Noted  . 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 Thyromegaly 06/29/2019  . Obesity (BMI 30-39.9) 06/29/2019  . Encounter for birth control pills maintenance 06/29/2019  . GANGLION-HAND/WRIST 11/05/2009    Social History   Tobacco Use  . Smoking status: Never Smoker  . Smokeless tobacco: Never Used  Substance Use Topics  . Alcohol use: No    Current Outpatient Medications:  .  busPIRone (BUSPAR) 7.5 MG tablet, Take 1 tablet (7.5 mg total) by mouth 2 (two) times daily., Disp: 60 tablet, Rfl: 1 .  cetirizine-pseudoephedrine (ZYRTEC-D) 5-120 MG tablet, Take 1 tablet by mouth daily., Disp: 30 tablet, Rfl: 0 .  cyclobenzaprine (FLEXERIL) 10 MG tablet, Take 1 tablet (10 mg total) by mouth 3 (three) times daily as needed for muscle spasms., Disp: 30 tablet, Rfl: 0 .  fluticasone (FLONASE) 50 MCG/ACT nasal spray, Place 2 sprays into both nostrils daily., Disp: 16 g, Rfl: 6 .  LESSINA-28  0.1-20 MG-MCG tablet, Take 1 tablet by mouth daily., Disp: 84 tablet, Rfl: 3 .  meloxicam (MOBIC) 15 MG tablet, Take 1 tablet (15 mg total) by mouth daily., Disp: 30 tablet, Rfl: 0 .  phentermine 37.5 MG capsule, Take 1 capsule (37.5 mg total) by mouth every morning., Disp: 90 capsule, Rfl: 0  Allergies  Allergen Reactions  . Amoxicillin Swelling  . Pineapple Swelling    Objective:   There were no vitals taken for this visit. AAOx3, NAD but obviously not feeling well NCAT, EOMI No obvious CN deficits Coloring WNL Pt is able to speak clearly, coherently without shortness of breath or increased work of breathing.  Thought process is linear.  Mood is appropriate.   Assessment and Plan:   COVID- new.  Pt has had multiple negative but also multiple positive tests at this point.  She is now symptomatic w/ COVID sxs- body aches, low grade fevers, chills, cough.  Discussed that she will need to isolate for at least 10 days, possibly longer depending on symptoms.  Reviewed supportive care and red flags that should prompt return.  Pt expressed understanding and is in agreement w/ plan.    Neena Rhymes, MD 03/04/2020

## 2020-03-10 ENCOUNTER — Other Ambulatory Visit: Payer: Self-pay

## 2020-03-10 ENCOUNTER — Telehealth (INDEPENDENT_AMBULATORY_CARE_PROVIDER_SITE_OTHER): Payer: No Typology Code available for payment source | Admitting: Family Medicine

## 2020-03-10 ENCOUNTER — Encounter: Payer: Self-pay | Admitting: Family Medicine

## 2020-03-10 VITALS — BP 124/82 | Temp 99.0°F | Wt 185.6 lb

## 2020-03-10 DIAGNOSIS — E669 Obesity, unspecified: Secondary | ICD-10-CM | POA: Diagnosis not present

## 2020-03-10 DIAGNOSIS — U071 COVID-19: Secondary | ICD-10-CM | POA: Diagnosis not present

## 2020-03-10 DIAGNOSIS — F900 Attention-deficit hyperactivity disorder, predominantly inattentive type: Secondary | ICD-10-CM | POA: Diagnosis not present

## 2020-03-10 MED ORDER — METHYLPHENIDATE HCL ER (OSM) 18 MG PO TBCR: 18.0000 mg | EXTENDED_RELEASE_TABLET | Freq: Every day | ORAL | 0 refills | Status: DC

## 2020-03-10 NOTE — Progress Notes (Signed)
I connected with  Denver Faster on 03/10/20 by a video enabled telemedicine application and verified that I am speaking with the correct person using two identifiers.   I discussed the limitations of evaluation and management by telemedicine. The patient expressed understanding and agreed to proceed.

## 2020-03-10 NOTE — Progress Notes (Signed)
Virtual Visit via Video   I connected with patient on 03/10/20 at 11:00 AM EST by a video enabled telemedicine application and verified that I am speaking with the correct person using two identifiers.  Location patient: Home Location provider: [facility] Persons participating in the virtual visit: Patient, Provider, CMA (Sabrina M)  I discussed the limitations of evaluation and management by telemedicine and the availability of in person appointments. The patient expressed understanding and agreed to proceed.  Subjective:   HPI:   ADHD- pt reports she was able to focus much better when taking her Phentermine.  Since she completed medication, 'it's been terrible'.  Unable to attend to tasks, it is taking much longer to complete things, unable to stay focused.    Obesity- pt states that Phentermine did not 'drop my weight like when I first started'.  Is still working on diet and exercise.  COVID- pt reports she started to turn the corner the evening of 12/25.  Yesterday was feeling better.  Admits she's not back to normal, but much improved.  ROS:   See pertinent positives and negatives per HPI.  Patient Active Problem List   Diagnosis Date Noted  . 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 Thyromegaly 06/29/2019  . Obesity (BMI 30-39.9) 06/29/2019  . Encounter for birth control pills maintenance 06/29/2019  . GANGLION-HAND/WRIST 11/05/2009    Social History   Tobacco Use  . Smoking status: Never Smoker  . Smokeless tobacco: Never Used  Substance Use Topics  . Alcohol use: No    Current Outpatient Medications:  .  LESSINA-28 0.1-20 MG-MCG tablet, Take 1 tablet by mouth daily., Disp: 84 tablet, Rfl: 3 .  meloxicam (MOBIC) 15 MG tablet, Take 1 tablet (15 mg total) by mouth daily., Disp: 30 tablet, Rfl: 0 .  busPIRone (BUSPAR) 7.5 MG tablet, Take 1 tablet (7.5 mg total) by mouth 2 (two) times daily. (Patient not  taking: Reported on 03/10/2020), Disp: 60 tablet, Rfl: 1 .  cetirizine-pseudoephedrine (ZYRTEC-D) 5-120 MG tablet, Take 1 tablet by mouth daily. (Patient not taking: Reported on 03/10/2020), Disp: 30 tablet, Rfl: 0 .  cyclobenzaprine (FLEXERIL) 10 MG tablet, Take 1 tablet (10 mg total) by mouth 3 (three) times daily as needed for muscle spasms. (Patient not taking: Reported on 03/10/2020), Disp: 30 tablet, Rfl: 0 .  fluticasone (FLONASE) 50 MCG/ACT nasal spray, Place 2 sprays into both nostrils daily. (Patient not taking: Reported on 03/10/2020), Disp: 16 g, Rfl: 6 .  phentermine 37.5 MG capsule, Take 1 capsule (37.5 mg total) by mouth every morning. (Patient not taking: Reported on 03/10/2020), Disp: 90 capsule, Rfl: 0  Allergies  Allergen Reactions  . Amoxicillin Swelling  . Pineapple Swelling    Objective:   BP 124/82   Temp 99 F (37.2 C) (Temporal)   Wt 185 lb 9.6 oz (84.2 kg)   BMI 30.89 kg/m  AAOx3, NAD NCAT, EOMI No obvious CN deficits Coloring WNL Pt is able to speak clearly, coherently without shortness of breath or increased work of breathing.  Thought process is linear.  Mood is appropriate.   Assessment and Plan:   ADHD- deteriorated.  Pt had better focus and task completion when she was taking the phentermine.  Will start low dose Concerta and monitor for improvement.  Discussed appropriate way to take medication, the fact that it is a controlled substance, and not to be shared with anyone.  Pt expressed understanding and is in agreement w/ plan.  Obesity- ongoing issue for pt.  She is attempting to work on healthy diet and regular exercise despite finishing her phentermine.  I applauded her efforts and I suspect she will have some degree of appetite reduction in starting the Concerta.  Will follow.  COVID- improving.  Pt still has low grade temp but is overall feeling better than last week.  She will remain out of work for the whole 10 days.  Neena Rhymes,  MD 03/10/2020

## 2020-03-11 ENCOUNTER — Encounter: Payer: Self-pay | Admitting: Family Medicine

## 2020-03-13 ENCOUNTER — Telehealth: Payer: Self-pay

## 2020-03-13 NOTE — Telephone Encounter (Signed)
Called and spoke with patients plan and did a PA for methylphenidate 18mg  and it was approved. for 03/12/2020 to 03/12/2021. Pharmacy was informed as well as the patient.

## 2020-03-20 ENCOUNTER — Encounter: Payer: Self-pay | Admitting: Family Medicine

## 2020-04-11 ENCOUNTER — Telehealth (INDEPENDENT_AMBULATORY_CARE_PROVIDER_SITE_OTHER): Payer: No Typology Code available for payment source | Admitting: Family Medicine

## 2020-04-11 ENCOUNTER — Encounter: Payer: Self-pay | Admitting: Family Medicine

## 2020-04-11 DIAGNOSIS — T50905A Adverse effect of unspecified drugs, medicaments and biological substances, initial encounter: Secondary | ICD-10-CM | POA: Diagnosis not present

## 2020-04-11 DIAGNOSIS — F900 Attention-deficit hyperactivity disorder, predominantly inattentive type: Secondary | ICD-10-CM

## 2020-04-11 MED ORDER — AMPHETAMINE-DEXTROAMPHET ER 20 MG PO CP24
20.0000 mg | ORAL_CAPSULE | ORAL | 0 refills | Status: DC
Start: 2020-04-11 — End: 2020-04-29

## 2020-04-11 NOTE — Progress Notes (Signed)
   Virtual Visit via Video   I connected with patient on 04/11/20 at  8:30 AM EST by a video enabled telemedicine application and verified that I am speaking with the correct person using two identifiers.  Location patient: Home Location provider: [facility] Persons participating in the virtual visit: Patient, Provider, CMA (Sabrina M)  I discussed the limitations of evaluation and management by telemedicine and the availability of in person appointments. The patient expressed understanding and agreed to proceed.  Subjective:   HPI:   ADHD- pt reports the Concerta did not improve her focus and task management.  The Concerta was also causing nausea and vomiting.  She stopped the medication earlier this week and GI sxs have improved.  Pt reports she is struggling at work to perform and stay on task.  ROS:   See pertinent positives and negatives per HPI.  Patient Active Problem List   Diagnosis Date Noted  . 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 Thyromegaly 06/29/2019  . Obesity (BMI 30-39.9) 06/29/2019  . Encounter for birth control pills maintenance 06/29/2019  . GANGLION-HAND/WRIST 11/05/2009    Social History   Tobacco Use  . Smoking status: Never Smoker  . Smokeless tobacco: Never Used  Substance Use Topics  . Alcohol use: No    Current Outpatient Medications:  .  LESSINA-28 0.1-20 MG-MCG tablet, Take 1 tablet by mouth daily., Disp: 84 tablet, Rfl: 3 .  meloxicam (MOBIC) 15 MG tablet, Take 1 tablet (15 mg total) by mouth daily., Disp: 30 tablet, Rfl: 0 .  methylphenidate (CONCERTA) 18 MG PO CR tablet, Take 1 tablet (18 mg total) by mouth daily., Disp: 30 tablet, Rfl: 0 .  busPIRone (BUSPAR) 7.5 MG tablet, Take 1 tablet (7.5 mg total) by mouth 2 (two) times daily. (Patient not taking: No sig reported), Disp: 60 tablet, Rfl: 1 .  cetirizine-pseudoephedrine (ZYRTEC-D) 5-120 MG tablet, Take 1 tablet by mouth daily.  (Patient not taking: No sig reported), Disp: 30 tablet, Rfl: 0 .  cyclobenzaprine (FLEXERIL) 10 MG tablet, Take 1 tablet (10 mg total) by mouth 3 (three) times daily as needed for muscle spasms. (Patient not taking: No sig reported), Disp: 30 tablet, Rfl: 0 .  fluticasone (FLONASE) 50 MCG/ACT nasal spray, Place 2 sprays into both nostrils daily. (Patient not taking: No sig reported), Disp: 16 g, Rfl: 6 .  phentermine 37.5 MG capsule, Take 1 capsule (37.5 mg total) by mouth every morning. (Patient not taking: No sig reported), Disp: 90 capsule, Rfl: 0  Allergies  Allergen Reactions  . Amoxicillin Swelling  . Pineapple Swelling    Objective:   There were no vitals taken for this visit. AAOx3, NAD NCAT, EOMI No obvious CN deficits Pt is able to speak clearly, coherently without shortness of breath or increased work of breathing.  Thought process is linear.  Mood is appropriate.   Assessment and Plan:   ADHD- Concerta did not address her issues w/ focus and task completion.  And worse than that, it caused nausea and vomiting.  Based on this, will switch to Adderall XR 20mg  daily and monitor closely for symptom improvement and better side effect profile.  Pt expressed understanding and is in agreement w/ plan.    , MD 04/11/2020

## 2020-04-11 NOTE — Progress Notes (Signed)
I connected with  Denver Faster on 04/11/20 by a video enabled telemedicine application and verified that I am speaking with the correct person using two identifiers.   I discussed the limitations of evaluation and management by telemedicine. The patient expressed understanding and agreed to proceed.

## 2020-04-24 ENCOUNTER — Encounter: Payer: Self-pay | Admitting: Family Medicine

## 2020-04-29 MED ORDER — AMPHETAMINE-DEXTROAMPHET ER 25 MG PO CP24
25.0000 mg | ORAL_CAPSULE | ORAL | 0 refills | Status: DC
Start: 2020-04-29 — End: 2020-05-25

## 2020-05-25 ENCOUNTER — Other Ambulatory Visit: Payer: Self-pay | Admitting: Family Medicine

## 2020-05-26 MED ORDER — AMPHETAMINE-DEXTROAMPHET ER 25 MG PO CP24: 25.0000 mg | ORAL_CAPSULE | ORAL | 0 refills | Status: DC

## 2020-05-26 NOTE — Telephone Encounter (Signed)
Requesting:Adderall 25mg  Contract: UDS: Last Visit:04/11/20 v/v Next Visit:n/a Last Refill:04/29/20 30 tabs 0 refills  Please Advise

## 2020-07-01 ENCOUNTER — Other Ambulatory Visit: Payer: Self-pay | Admitting: Family Medicine

## 2020-07-01 NOTE — Telephone Encounter (Signed)
LFD 05/26/20 #30 with no refills LOV 04/11/20 NOV none

## 2020-07-02 MED ORDER — AMPHETAMINE-DEXTROAMPHET ER 25 MG PO CP24: 25.0000 mg | ORAL_CAPSULE | ORAL | 0 refills | Status: DC

## 2020-07-30 ENCOUNTER — Other Ambulatory Visit: Payer: Self-pay | Admitting: Family Medicine

## 2020-07-30 MED ORDER — AMPHETAMINE-DEXTROAMPHET ER 25 MG PO CP24: 25.0000 mg | ORAL_CAPSULE | ORAL | 0 refills | Status: DC

## 2020-07-30 NOTE — Telephone Encounter (Signed)
LFD 07/02/20 #30 with no refills LOV 04/11/20 NOV none

## 2020-08-05 ENCOUNTER — Encounter: Payer: Self-pay | Admitting: Family Medicine

## 2020-08-31 ENCOUNTER — Other Ambulatory Visit: Payer: Self-pay | Admitting: Family Medicine

## 2020-09-01 MED ORDER — AMPHETAMINE-DEXTROAMPHET ER 25 MG PO CP24: 25.0000 mg | ORAL_CAPSULE | ORAL | 0 refills | Status: DC

## 2020-09-01 NOTE — Telephone Encounter (Signed)
LFD 07/30/20 #30 with no refills LOV 04/11/20 NOV none

## 2020-09-10 ENCOUNTER — Encounter: Payer: Self-pay | Admitting: *Deleted

## 2020-09-22 ENCOUNTER — Encounter: Payer: Self-pay | Admitting: Pediatrics

## 2020-10-01 ENCOUNTER — Other Ambulatory Visit: Payer: Self-pay | Admitting: Family Medicine

## 2020-10-01 NOTE — Telephone Encounter (Signed)
Last filled 6/20/222 #30 with no refills Last visit 04/11/20 Next visit none

## 2020-10-03 ENCOUNTER — Other Ambulatory Visit: Payer: Self-pay | Admitting: Family Medicine

## 2020-10-03 MED ORDER — AMPHETAMINE-DEXTROAMPHET ER 25 MG PO CP24: 25.0000 mg | ORAL_CAPSULE | ORAL | 0 refills | Status: DC

## 2020-10-03 NOTE — Telephone Encounter (Signed)
LAST APPOINTMENT DATE: 10/01/2020   NEXT APPOINTMENT DATE: Visit date not found    LAST REFILL:09/01/2020  QTY: 30

## 2020-10-03 NOTE — Telephone Encounter (Signed)
Patient is requesting a refill of the following medications: Requested Prescriptions   Pending Prescriptions Disp Refills   amphetamine-dextroamphetamine (ADDERALL XR) 25 MG 24 hr capsule 30 capsule 0    Sig: Take 1 capsule by mouth every morning.    Date of patient request: 10/03/20 Last office visit: 04/11/20 Date of last refill: 09/01/20 Last refill amount: 30

## 2020-10-06 MED ORDER — AMPHETAMINE-DEXTROAMPHET ER 25 MG PO CP24: 25.0000 mg | ORAL_CAPSULE | ORAL | 0 refills | Status: DC

## 2020-10-21 ENCOUNTER — Other Ambulatory Visit: Payer: Self-pay | Admitting: Family Medicine

## 2020-11-08 ENCOUNTER — Other Ambulatory Visit: Payer: Self-pay | Admitting: Registered Nurse

## 2020-11-10 ENCOUNTER — Encounter: Payer: Self-pay | Admitting: Family Medicine

## 2020-11-10 ENCOUNTER — Other Ambulatory Visit: Payer: Self-pay

## 2020-11-10 NOTE — Telephone Encounter (Signed)
Pharmacy is calling - [facility] - Carollee Herter - Pharmacist [phone] - Needs a new script for patient's adderall - Please advise.

## 2020-11-10 NOTE — Telephone Encounter (Signed)
Request sent for approval.

## 2020-11-10 NOTE — Telephone Encounter (Signed)
Duplicate, unable to refuse. 

## 2020-11-10 NOTE — Telephone Encounter (Signed)
Pharmacy is calling - [facility] - [address] [address]ter - Pharmacist [phone] - Needs a new script for patient's adderall - Please advise.      Note   LFD 10/06/20 #30 with no refills LOV 04/11/20 NOV none

## 2020-11-11 ENCOUNTER — Other Ambulatory Visit: Payer: Self-pay | Admitting: Family

## 2020-11-11 MED ORDER — AMPHETAMINE-DEXTROAMPHET ER 25 MG PO CP24: 25.0000 mg | ORAL_CAPSULE | ORAL | 0 refills | Status: DC

## 2020-12-08 ENCOUNTER — Other Ambulatory Visit: Payer: Self-pay | Admitting: Registered Nurse

## 2020-12-10 ENCOUNTER — Encounter: Payer: Self-pay | Admitting: Family Medicine

## 2020-12-11 ENCOUNTER — Other Ambulatory Visit: Payer: Self-pay

## 2020-12-11 MED ORDER — AMPHETAMINE-DEXTROAMPHET ER 25 MG PO CP24: 25.0000 mg | ORAL_CAPSULE | ORAL | 0 refills | Status: DC

## 2020-12-11 NOTE — Telephone Encounter (Signed)
Patient is requesting a refill of the following medications: Requested Prescriptions   Pending Prescriptions Disp Refills   amphetamine-dextroamphetamine (ADDERALL XR) 25 MG 24 hr capsule 30 capsule 0    Sig: Take 1 capsule by mouth every morning.    Date of patient request: 12/11/2020 Last office visit: 12/26/2019 Date of last refill: 12/11/2020 Last refill amount: 30 capsules Follow up time period per chart: N/a

## 2020-12-11 NOTE — Telephone Encounter (Signed)
Requesting:Adderral  Contract: UDS: Last Visit:04/11/20 v/v Next Visit:n/a Last Refill:11/11/20 30 tabs 0 refills  Please Advise

## 2021-01-09 ENCOUNTER — Other Ambulatory Visit: Payer: Self-pay | Admitting: Family Medicine

## 2021-01-09 MED ORDER — AMPHETAMINE-DEXTROAMPHET ER 25 MG PO CP24: 25.0000 mg | ORAL_CAPSULE | ORAL | 0 refills | Status: DC

## 2021-02-13 ENCOUNTER — Other Ambulatory Visit: Payer: Self-pay | Admitting: Family Medicine

## 2021-02-13 ENCOUNTER — Encounter: Payer: Self-pay | Admitting: Family Medicine

## 2021-02-13 MED ORDER — AMPHETAMINE-DEXTROAMPHET ER 25 MG PO CP24: 25.0000 mg | ORAL_CAPSULE | ORAL | 0 refills | Status: DC

## 2021-02-13 NOTE — Telephone Encounter (Signed)
Patient is requesting a refill of the following medications: Requested Prescriptions   Pending Prescriptions Disp Refills   amphetamine-dextroamphetamine (ADDERALL XR) 25 MG 24 hr capsule 30 capsule 0    Sig: Take 1 capsule by mouth every morning.    Date of patient request: 02/13/2021 Last office visit: 03/10/2020 VV Date of last refill: 01/09/2021 Last refill amount: 30 capsules Follow up time period per chart: n/a

## 2021-02-17 ENCOUNTER — Encounter: Payer: Self-pay | Admitting: Family Medicine

## 2021-02-17 ENCOUNTER — Ambulatory Visit (INDEPENDENT_AMBULATORY_CARE_PROVIDER_SITE_OTHER): Payer: No Typology Code available for payment source | Admitting: Family Medicine

## 2021-02-17 VITALS — BP 118/82 | HR 87 | Temp 97.7°F | Resp 16 | Wt 158.8 lb

## 2021-02-17 DIAGNOSIS — M533 Sacrococcygeal disorders, not elsewhere classified: Secondary | ICD-10-CM

## 2021-02-17 MED ORDER — TRAMADOL HCL 50 MG PO TABS: 50.0000 mg | ORAL_TABLET | Freq: Three times a day (TID) | ORAL | 0 refills | Status: AC | PRN

## 2021-02-17 MED ORDER — NAPROXEN 500 MG PO TABS: 500.0000 mg | ORAL_TABLET | Freq: Two times a day (BID) | ORAL | 0 refills | Status: DC

## 2021-02-17 NOTE — Patient Instructions (Signed)
Follow up as needed or as scheduled START the Naproxen twice daily- take w/ food ICE! Use the Tramadol only as needed for severe pain Get a butt donut to make sitting less painful You can add Tylenol to the Naproxen but don't add any additional ibuprofen, aleve, motrin, etc Call with any questions or concerns Hang in there! Enjoy your trip!!!

## 2021-02-17 NOTE — Progress Notes (Signed)
   Subjective:    Patient ID: Patricia Kane, female    DOB: [birth year], 25 y.o.   MRN: [id number]  HPI Coccyx pain- pt fell on 11/23 while going down the stairs and landed directly on back side.  Pt has had pain since.  Pain w/ sitting, lying, bending, or standing too long.  No visible bruising.  Did not hear or feel crack/pop during fall.  No radiation of pain.  Has continued to work.  Leaves country on Monday to go to [place].     Review of Systems For ROS see HPI   This visit occurred during the SARS-CoV-2 public health emergency.  Safety protocols were in place, including screening questions prior to the visit, additional usage of staff PPE, and extensive cleaning of exam room while observing appropriate contact time as indicated for disinfecting solutions.      Objective:   Physical Exam Vitals reviewed.  Constitutional:      General: She is not in acute distress.    Appearance: Normal appearance. She is not ill-appearing.  HENT:     Head: Normocephalic and atraumatic.  Eyes:     Extraocular Movements: Extraocular movements intact.     Conjunctiva/sclera: Conjunctivae normal.     Pupils: Pupils are equal, round, and reactive to light.  Musculoskeletal:        General: Tenderness (TTP over coccyx but no step off or obvious deformity) present. No deformity.  Skin:    General: Skin is warm and dry.     Findings: No bruising.  Neurological:     General: No focal deficit present.     Mental Status: She is alert and oriented to person, place, and time.  Psychiatric:        Mood and Affect: Mood normal.        Behavior: Behavior normal.        Thought Content: Thought content normal.          Assessment & Plan:  Coccyx pain- new.  Occurred after a fall down the stairs.  No obvious deformity but pain continues and makes sitting, lying, or even standing for too long difficult.  Encouraged butt donut.  Will start scheduled NSAIDs.  Tramadol prn.  Reviewed that either a fracture  or a bone bruise will take a number of weeks to heal but she should have improvement as time goes on.  Pt expressed understanding and is in agreement w/ plan.

## 2021-03-17 ENCOUNTER — Other Ambulatory Visit: Payer: Self-pay | Admitting: Family Medicine

## 2021-03-17 MED ORDER — AMPHETAMINE-DEXTROAMPHET ER 25 MG PO CP24: 25.0000 mg | ORAL_CAPSULE | ORAL | 0 refills | Status: DC

## 2021-03-18 ENCOUNTER — Other Ambulatory Visit: Payer: Self-pay

## 2021-03-18 MED ORDER — LEVONORGESTREL-ETHINYL ESTRAD 0.1-20 MG-MCG PO TABS: 1.0000 | ORAL_TABLET | Freq: Every day | ORAL | 1 refills | Status: DC

## 2021-04-17 ENCOUNTER — Other Ambulatory Visit: Payer: Self-pay | Admitting: Family Medicine

## 2021-04-21 ENCOUNTER — Other Ambulatory Visit: Payer: Self-pay | Admitting: Family Medicine

## 2021-04-21 MED ORDER — AMPHETAMINE-DEXTROAMPHET ER 25 MG PO CP24: 25.0000 mg | ORAL_CAPSULE | ORAL | 0 refills | Status: DC

## 2021-05-14 ENCOUNTER — Encounter: Payer: Self-pay | Admitting: Family Medicine

## 2021-05-22 ENCOUNTER — Ambulatory Visit (INDEPENDENT_AMBULATORY_CARE_PROVIDER_SITE_OTHER): Payer: No Typology Code available for payment source | Admitting: Family Medicine

## 2021-05-22 ENCOUNTER — Encounter: Payer: Self-pay | Admitting: Family Medicine

## 2021-05-22 VITALS — BP 112/78 | HR 90 | Temp 97.6°F | Resp 16 | Wt 168.4 lb

## 2021-05-22 DIAGNOSIS — F900 Attention-deficit hyperactivity disorder, predominantly inattentive type: Secondary | ICD-10-CM

## 2021-05-22 MED ORDER — AMPHETAMINE-DEXTROAMPHET ER 25 MG PO CP24: 25.0000 mg | ORAL_CAPSULE | ORAL | 0 refills | Status: DC

## 2021-05-22 MED ORDER — AMPHETAMINE-DEXTROAMPHETAMINE 30 MG PO TABS: ORAL_TABLET | ORAL | 0 refills | Status: DC

## 2021-05-22 NOTE — Progress Notes (Signed)
? ?  Subjective:  ? ? Patient ID: Patricia Kane, female    DOB: 1995/04/08, 26 y.o.   MRN: [id number] ? ?HPI ?ADHD- pt reports she had to switch from brand name Adderall to generic in January and since then, she doesn't feel the medication is working as well.  Before the switch, she was able to focus throughout her entire work day.  Now she feels meds are wearing off too soon and then she's not able to focus, complete her tasks, she feels overwhelmed, has racing thoughts, gets anxious.  Takes medication at 10 pm, work starts at 11pm, will wear off between 2:30 and 3am.  Denies palpitations, insomnia, mood swings, anorexia. ? ? ?Review of Systems ?For ROS see HPI  ? ?This visit occurred during the SARS-CoV-2 public health emergency.  Safety protocols were in place, including screening questions prior to the visit, additional usage of staff PPE, and extensive cleaning of exam room while observing appropriate contact time as indicated for disinfecting solutions.   ?   ?Objective:  ? Physical Exam ?Vitals reviewed.  ?Constitutional:   ?   General: She is not in acute distress. ?   Appearance: Normal appearance. She is well-developed. She is not ill-appearing.  ?HENT:  ?   Head: Normocephalic and atraumatic.  ?Eyes:  ?   Conjunctiva/sclera: Conjunctivae normal.  ?   Pupils: Pupils are equal, round, and reactive to light.  ?Neck:  ?   Thyroid: No thyromegaly.  ?Cardiovascular:  ?   Rate and Rhythm: Normal rate and regular rhythm.  ?   Heart sounds: Normal heart sounds. No murmur heard. ?Pulmonary:  ?   Effort: Pulmonary effort is normal. No respiratory distress.  ?   Breath sounds: Normal breath sounds.  ?Abdominal:  ?   General: There is no distension.  ?   Palpations: Abdomen is soft.  ?   Tenderness: There is no abdominal tenderness.  ?Musculoskeletal:  ?   Cervical back: Normal range of motion and neck supple.  ?Lymphadenopathy:  ?   Cervical: No cervical adenopathy.  ?Skin: ?   General: Skin is warm and dry.   ?Neurological:  ?   Mental Status: She is alert and oriented to person, place, and time.  ?Psychiatric:     ?   Behavior: Behavior normal.  ? ? ? ? ? ?   ?Assessment & Plan:  ? ? ?

## 2021-05-22 NOTE — Patient Instructions (Signed)
Follow up by MyChart and let me know how things are going w/ the additional dose ?Continue the extended release prior to work and then add the short acting when you notice it wearing off ?Call with any questions or concerns ?Stay Safe!  Stay Healthy! ?Have a great weekend!!! ?

## 2021-05-22 NOTE — Assessment & Plan Note (Signed)
Deteriorated.  Pt finds that her medication is wearing off half way through her shift and she then struggles to complete her tasks and stay focused.  Discussed option of increasing long acting medication vs adding short acting medication for additional control.  Pt would like to try adding short acting medication since she works 3rd shift and would like to be able to come home and go to bed.  New prescription sent along w/ instructions for proper use. ?
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
## 2021-06-15 ENCOUNTER — Encounter: Payer: Self-pay | Admitting: Family Medicine

## 2021-06-19 ENCOUNTER — Other Ambulatory Visit: Payer: Self-pay | Admitting: Family Medicine

## 2021-06-19 NOTE — Telephone Encounter (Signed)
Patient is requesting a refill of the following medications: ?Requested Prescriptions  ? ?Pending Prescriptions Disp Refills  ? amphetamine-dextroamphetamine (ADDERALL XR) 25 MG 24 hr capsule 30 capsule 0  ?  Sig: Take 1 capsule by mouth every morning.  ? amphetamine-dextroamphetamine (ADDERALL) 30 MG tablet 30 tablet 0  ?  Sig: Take 1 tablet daily as needed for additional focus  ? ? ?Date of patient request: 06/19/21 ?Last office visit: 05/22/21 ?Date of last refill: 05/22/21 ?Last refill amount: 30 ? ?

## 2021-06-22 ENCOUNTER — Other Ambulatory Visit: Payer: Self-pay | Admitting: Family Medicine

## 2021-06-23 MED ORDER — AMPHETAMINE-DEXTROAMPHETAMINE 30 MG PO TABS: ORAL_TABLET | ORAL | 0 refills | Status: DC

## 2021-06-23 MED ORDER — AMPHETAMINE-DEXTROAMPHET ER 25 MG PO CP24: 25.0000 mg | ORAL_CAPSULE | ORAL | 0 refills | Status: DC

## 2021-07-25 ENCOUNTER — Other Ambulatory Visit: Payer: Self-pay | Admitting: Registered Nurse

## 2021-07-27 MED ORDER — AMPHETAMINE-DEXTROAMPHETAMINE 30 MG PO TABS: ORAL_TABLET | ORAL | 0 refills | Status: DC

## 2021-07-27 MED ORDER — AMPHETAMINE-DEXTROAMPHET ER 25 MG PO CP24: 25.0000 mg | ORAL_CAPSULE | ORAL | 0 refills | Status: DC

## 2021-08-07 ENCOUNTER — Ambulatory Visit: Payer: No Typology Code available for payment source

## 2021-08-27 ENCOUNTER — Other Ambulatory Visit: Payer: Self-pay | Admitting: Family Medicine

## 2021-08-27 MED ORDER — AMPHETAMINE-DEXTROAMPHETAMINE 30 MG PO TABS: ORAL_TABLET | ORAL | 0 refills | Status: DC

## 2021-08-27 MED ORDER — AMPHETAMINE-DEXTROAMPHET ER 25 MG PO CP24: 25.0000 mg | ORAL_CAPSULE | ORAL | 0 refills | Status: DC

## 2021-09-09 ENCOUNTER — Encounter: Payer: No Typology Code available for payment source | Admitting: Obstetrics & Gynecology

## 2021-10-04 ENCOUNTER — Other Ambulatory Visit: Payer: Self-pay | Admitting: Family Medicine

## 2021-10-05 MED ORDER — AMPHETAMINE-DEXTROAMPHET ER 25 MG PO CP24: 25.0000 mg | ORAL_CAPSULE | ORAL | 0 refills | Status: DC

## 2021-10-05 MED ORDER — AMPHETAMINE-DEXTROAMPHETAMINE 30 MG PO TABS: ORAL_TABLET | ORAL | 0 refills | Status: DC

## 2021-10-18 ENCOUNTER — Other Ambulatory Visit: Payer: Self-pay

## 2021-10-19 ENCOUNTER — Encounter: Payer: No Typology Code available for payment source | Admitting: Obstetrics & Gynecology

## 2021-10-27 ENCOUNTER — Other Ambulatory Visit: Payer: Self-pay

## 2021-11-02 ENCOUNTER — Other Ambulatory Visit: Payer: Self-pay

## 2021-11-21 ENCOUNTER — Other Ambulatory Visit: Payer: Self-pay | Admitting: Family Medicine

## 2021-11-21 MED ORDER — AMPHETAMINE-DEXTROAMPHET ER 25 MG PO CP24: 25.0000 mg | ORAL_CAPSULE | ORAL | 0 refills | Status: DC

## 2021-11-21 MED ORDER — AMPHETAMINE-DEXTROAMPHETAMINE 30 MG PO TABS: ORAL_TABLET | ORAL | 0 refills | Status: DC

## 2021-12-17 ENCOUNTER — Ambulatory Visit (INDEPENDENT_AMBULATORY_CARE_PROVIDER_SITE_OTHER): Payer: No Typology Code available for payment source | Admitting: Family Medicine

## 2021-12-17 ENCOUNTER — Encounter: Payer: Self-pay | Admitting: Family Medicine

## 2021-12-17 DIAGNOSIS — S8012XA Contusion of left lower leg, initial encounter: Secondary | ICD-10-CM | POA: Diagnosis not present

## 2021-12-17 DIAGNOSIS — M542 Cervicalgia: Secondary | ICD-10-CM | POA: Diagnosis not present

## 2021-12-17 DIAGNOSIS — R519 Headache, unspecified: Secondary | ICD-10-CM | POA: Diagnosis not present

## 2021-12-17 MED ORDER — CYCLOBENZAPRINE HCL 10 MG PO TABS: 10.0000 mg | ORAL_TABLET | Freq: Three times a day (TID) | ORAL | 0 refills | Status: DC | PRN

## 2021-12-17 MED ORDER — DICLOFENAC SODIUM 1 % EX GEL
4.0000 g | Freq: Four times a day (QID) | CUTANEOUS | 1 refills | Status: DC
Start: 2021-12-17 — End: 2022-03-25
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
## 2021-12-24 ENCOUNTER — Other Ambulatory Visit: Payer: Self-pay | Admitting: Obstetrics and Gynecology

## 2021-12-24 DIAGNOSIS — O283 Abnormal ultrasonic finding on antenatal screening of mother: Secondary | ICD-10-CM

## 2021-12-24 DIAGNOSIS — Z363 Encounter for antenatal screening for malformations: Secondary | ICD-10-CM

## 2021-12-24 DIAGNOSIS — Z3A21 21 weeks gestation of pregnancy: Secondary | ICD-10-CM

## 2021-12-28 ENCOUNTER — Encounter: Payer: Self-pay | Admitting: *Deleted

## 2021-12-29 ENCOUNTER — Ambulatory Visit: Payer: No Typology Code available for payment source | Admitting: *Deleted

## 2021-12-29 ENCOUNTER — Ambulatory Visit (HOSPITAL_BASED_OUTPATIENT_CLINIC_OR_DEPARTMENT_OTHER): Payer: No Typology Code available for payment source | Admitting: Maternal & Fetal Medicine

## 2021-12-29 ENCOUNTER — Encounter: Payer: Self-pay | Admitting: *Deleted

## 2021-12-29 ENCOUNTER — Ambulatory Visit: Payer: No Typology Code available for payment source | Attending: Obstetrics and Gynecology

## 2021-12-29 DIAGNOSIS — O35DXX0 Maternal care for other (suspected) fetal abnormality and damage, fetal gastrointestinal anomalies, not applicable or unspecified: Secondary | ICD-10-CM | POA: Insufficient documentation

## 2021-12-29 DIAGNOSIS — Z363 Encounter for antenatal screening for malformations: Secondary | ICD-10-CM | POA: Insufficient documentation

## 2021-12-29 DIAGNOSIS — O358XX0 Maternal care for other (suspected) fetal abnormality and damage, not applicable or unspecified: Secondary | ICD-10-CM

## 2021-12-29 DIAGNOSIS — O283 Abnormal ultrasonic finding on antenatal screening of mother: Secondary | ICD-10-CM | POA: Insufficient documentation

## 2021-12-29 DIAGNOSIS — Z3A20 20 weeks gestation of pregnancy: Secondary | ICD-10-CM | POA: Diagnosis not present

## 2021-12-29 DIAGNOSIS — Z3A21 21 weeks gestation of pregnancy: Secondary | ICD-10-CM | POA: Insufficient documentation

## 2021-12-29 NOTE — Progress Notes (Signed)
MFM Brief Note  Patricia Kane is a 26 yo G2P0 who is seen today at 50 w 6 d with an EDD 05/12/21. He is dated by at 10 w 5 d ultrasound.  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 She had a low risk NIPS and carrier screen       12/29/2021    1:39 PM 12/17/2021    2:15 PM 05/22/2021    8:40 AM  Vitals with BMI  Height  5\' 5"    Weight   168 lbs 6 oz  Systolic 112 120  Diastolic 60 68 78  Pulse 72 92 90   Past Medical History:  Diagnosis Date   ADHD    GERD (gastroesophageal reflux disease)    Pleurisy    Past Surgical History:  Procedure Laterality Date   WISDOM TOOTH EXTRACTION     Social History   Socioeconomic History   Marital status: Single    Spouse name: Not on file   Number of children: Not on file   Years of education: Not on file   Highest education level: Not on file  Occupational History   Not on file  Tobacco Use   Smoking status: Never   Smokeless tobacco: Never  Vaping Use   Vaping Use: Never used  Substance and Sexual Activity   Alcohol use: No   Drug use: No   Sexual activity: Yes    Birth control/protection: Pill  Other Topics Concern   Not on file  Social History Narrative   Not on file   Social Determinants of Health   Financial Resource Strain: Not on file  Food Insecurity: Not on file  Transportation Needs: Not on file  Physical Activity: Not on file  Stress: Not on file  Social Connections: Not on file  Intimate Partner Violence: Not on file   Family History  Problem Relation Age of Onset   Healthy Mother    Healthy Father    Asthma Maternal Grandfather    COPD Maternal Grandfather    Heart attack Maternal Grandfather           Current Outpatient Medications (Hematological):    folic acid (FOLVITE) 0.5 MG tablet, Take 0.5 mg by mouth daily.  Current Outpatient Medications (Other):     amphetamine-dextroamphetamine (ADDERALL XR) 25 MG 24 hr capsule, Take 1 capsule by mouth every morning. (Patient not taking: Reported on 12/29/2021)   amphetamine-dextroamphetamine (ADDERALL) 30 MG tablet, Take 1 tablet daily as needed for additional focus (Patient not taking: Reported on 12/29/2021)   cyclobenzaprine (FLEXERIL) 10 MG tablet, Take 1 tablet (10 mg total) by mouth 3 (three) times daily as needed for muscle spasms.   diclofenac Sodium (VOLTAREN) 1 % GEL, Apply 4 g topically 4 (four) times daily.   Prenatal MV & Min w/FA-DHA (PRENATAL GUMMIES PO), Take by mouth.  Imaging: A single intrauterine pregnancy was observed with measurements consistent with dates.   We observed a anterior wall defect with a floating bowel adjacent right to the umbilical cord insertion site. There was no evidence of liver involvement.The bowel appears normal  and is not dilated.  There was otherwise good fetal movement and amniotic fluid volume.  Impression/Counseling: I discussed with Ms. 12/31/2021 the evaluation, management and prognosis for gastroschisis.  Gastroschisis is a full-thickness paraumbilical defect through which bowel herniates. In distinction to an omphalocele, a  membrane does not envelop the bowels. The incidence of gastroschisis is approximately 5/10,000 live births; global incidence is increasing.  The embryogenesis of gastroschisis is unclear. Proposed theories include: abnormal involution with vascular compromise of the right umbilical vein results in mesenchymal damage and weakened abdominal wall; vascular accident involving the right omphalomesenteric artery with ischemia of the body wall at the umbilical cord insertion site.   Risk factors for developing gastroschisis include young maternal age, smoking, vasoactive drug use, and low socioeconomic status. Hallmark diagnostic features include multiple loops of bowel seen floating freely in the amniotic fluid, with a typical cauliflower  appearance. Additionally, the abdominal wall defect is typically located to the right of the abdominal cord insertion; left-sided defects are less common. Approximately 85% of gastroschisis cases occur in isolation.   Common associated risks include intrauterine growth restriction and polyhydramnios..  Additional risks are associated with complications of bowel torsion that compromise the vascular supply or obstruction associated with atretic segments of intestine. Lastly, cardiac anomalies are present in 2-3% of cases of gastroschisis, as such, fetal echocardiogram may be considered. (This was not ordered today).  Our ultrasound examination revealed a relatively small periumbilical abdominal wall defect to the right of the midline with visceral herniation.  The adjacent abdominal cord insertion site appeared grossly normal and the intestine was the only herniated organ, and no membrane was noted. 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 Additionally, no evidence of malrotation or atresia was present on todays sonographic evaluation.   The perinatal complications, as well as adverse perinatal outcome associated with gastroschisis in pregnancy were discussed in detail with your patient.   In regards to timing of delivery, gastroschisis is associated with an increased risk of stillbirth. As such, we advise serial fetal growth surveillance every 4 weeks starting at 24 weeks, the initiation of antenatal testing in the form of twice weekly NST or weekly biophysical profile beginning at 32-34 weeks and delivery at 37 weeks.   Cesarean delivery should be reserved for obstetric indications. Systematic reviews and retrospective case series have shown no neonatal or long-term benefit with elective cesarean delivery.    Of note, survival in uncomplicated cases exceeds 90% while short-term complications include necrotizing enterocolitis (4%-10%) and central line infection (up to 24%). Long-term complications  include dysfunctional bowel (50%) and short bowel syndrome (5%).  Lastly, prenatal neonatology and pediatric surgery consultation to discuss postnatal management and prognosis are recommended.  I spent 30 minutes with > 50% in face to face consultation.  I have scheduled Ms. Glennie to return in 4 weeks.  All questions answered.   Vikki Ports, MD

## 2021-12-30 ENCOUNTER — Other Ambulatory Visit: Payer: Self-pay | Admitting: *Deleted

## 2021-12-30 DIAGNOSIS — O35DXX0 Maternal care for other (suspected) fetal abnormality and damage, fetal gastrointestinal anomalies, not applicable or unspecified: Secondary | ICD-10-CM

## 2021-12-31 ENCOUNTER — Encounter: Payer: Self-pay | Admitting: Family Medicine

## 2021-12-31 ENCOUNTER — Ambulatory Visit (INDEPENDENT_AMBULATORY_CARE_PROVIDER_SITE_OTHER): Payer: No Typology Code available for payment source | Admitting: Family Medicine

## 2021-12-31 VITALS — BP 120/78 | HR 81 | Temp 98.4°F | Resp 16 | Ht 65.0 in | Wt 195.1 lb

## 2021-12-31 DIAGNOSIS — M542 Cervicalgia: Secondary | ICD-10-CM

## 2021-12-31 DIAGNOSIS — M546 Pain in thoracic spine: Secondary | ICD-10-CM | POA: Diagnosis not present

## 2021-12-31 NOTE — Patient Instructions (Signed)
Follow up as needed or as scheduled They'll call you to schedule your physical therapy appt Continue the Tylenol, Flexeril, Voltaren gel and heat Call with any questions or concerns Hang in there!!!
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
## 2022-01-29 ENCOUNTER — Encounter: Payer: Self-pay | Admitting: *Deleted

## 2022-01-29 ENCOUNTER — Ambulatory Visit: Payer: No Typology Code available for payment source | Attending: Maternal & Fetal Medicine

## 2022-01-29 ENCOUNTER — Other Ambulatory Visit: Payer: Self-pay | Admitting: *Deleted

## 2022-01-29 ENCOUNTER — Ambulatory Visit: Payer: No Typology Code available for payment source | Admitting: *Deleted

## 2022-01-29 DIAGNOSIS — O35DXX0 Maternal care for other (suspected) fetal abnormality and damage, fetal gastrointestinal anomalies, not applicable or unspecified: Secondary | ICD-10-CM | POA: Diagnosis present

## 2022-01-29 DIAGNOSIS — O358XX0 Maternal care for other (suspected) fetal abnormality and damage, not applicable or unspecified: Secondary | ICD-10-CM | POA: Diagnosis not present

## 2022-01-29 DIAGNOSIS — Z362 Encounter for other antenatal screening follow-up: Secondary | ICD-10-CM

## 2022-01-29 DIAGNOSIS — Z3A25 25 weeks gestation of pregnancy: Secondary | ICD-10-CM | POA: Diagnosis not present

## 2022-01-29 DIAGNOSIS — O99213 Obesity complicating pregnancy, third trimester: Secondary | ICD-10-CM

## 2022-02-02 ENCOUNTER — Ambulatory Visit (HOSPITAL_COMMUNITY): Payer: No Typology Code available for payment source | Admitting: Physical Therapy

## 2022-02-19 ENCOUNTER — Ambulatory Visit (INDEPENDENT_AMBULATORY_CARE_PROVIDER_SITE_OTHER): Payer: No Typology Code available for payment source | Admitting: Surgery

## 2022-02-19 DIAGNOSIS — O35DXX0 Maternal care for other (suspected) fetal abnormality and damage, fetal gastrointestinal anomalies, not applicable or unspecified: Secondary | ICD-10-CM

## 2022-02-19 DIAGNOSIS — Z3A28 28 weeks gestation of pregnancy: Secondary | ICD-10-CM

## 2022-02-19 NOTE — Progress Notes (Signed)
[facility] Pediatric General Surgery    Thank you for the referral of Ms. Patricia Kane.  As you know, she is a 26 y.o. G2P0010 woman who is carrying a fetus at approximately [redacted] weeks gestation with a gastroschisis.  I appreciate the chance to see her in prenatal consultation, and we reviewed pertinent surgical issues in reference to prenatal, perinatal, and postnatal issues regarding this fetus.  As you know, Ms. Patricia Kane is in otherwise good health.  There is no family history of any congenital anomaly.  She has had an uncomplicated pregnancy to date and remains on prenatal vitamins.  Her social history is as follows:   reports that she has never smoked. She has never used smokeless tobacco. She reports that she does not drink alcohol and does not use drugs..  She underwent a fetal ultrasound at 25 weeks demonstrating a gastroschisis with non-dilated bowel.  It does not appear to contain liver.  A fetal echocardiogram was not performed.  In terms of prenatal issues, I reviewed the etiology of gastroschisis.  I reviewed the potential risk for intestinal atresia and intrauterine growth retardation.  At this stage in her gestation, there are no other options for prenatal therapies and I have encouraged her to keep up with her scheduled ultrasounds.  In terms of perinatal issues, I will defer all issues regarding perinatal management to your expertise.  Finally, in terms of postnatal issues, I will be able to assist with evaluation and care of this baby in consultation with the neonatology staff after birth.  The baby will be transferred to the NICU and will undergo a full assortment of supportive measures.  We reviewed surgical repair of the gastroschisis.  The type of repair will depend on the size of the gastroschisis.  We also reviewed the extended amount of time for neonates with gastroschisis to tolerate feeing into the intestines.  Finally, we reviewed survival statistics and the  comorbidities associated with this defect. If the gastroschisis is found to be complex, the baby will have to be transferred to another facility.  I think Ms. Patricia Kane found this session quite helpful.  It has been a pleasure performing this consultation.  Please do not hesitate to call our office with questions or concerns.  I spent approximately 45 minutes in face-to-face consultation.  Kandice Hams, MD, MHS

## 2022-02-24 ENCOUNTER — Ambulatory Visit: Payer: No Typology Code available for payment source | Attending: Obstetrics and Gynecology

## 2022-02-24 ENCOUNTER — Other Ambulatory Visit: Payer: Self-pay | Admitting: *Deleted

## 2022-02-24 ENCOUNTER — Ambulatory Visit: Payer: No Typology Code available for payment source | Admitting: *Deleted

## 2022-02-24 VITALS — BP 128/67 | HR 79

## 2022-02-24 DIAGNOSIS — Z3A29 29 weeks gestation of pregnancy: Secondary | ICD-10-CM | POA: Diagnosis not present

## 2022-02-24 DIAGNOSIS — O35DXX0 Maternal care for other (suspected) fetal abnormality and damage, fetal gastrointestinal anomalies, not applicable or unspecified: Secondary | ICD-10-CM

## 2022-02-24 DIAGNOSIS — O99213 Obesity complicating pregnancy, third trimester: Secondary | ICD-10-CM | POA: Insufficient documentation

## 2022-02-24 DIAGNOSIS — Z3689 Encounter for other specified antenatal screening: Secondary | ICD-10-CM

## 2022-02-24 DIAGNOSIS — E669 Obesity, unspecified: Secondary | ICD-10-CM

## 2022-03-23 ENCOUNTER — Encounter (HOSPITAL_COMMUNITY): Payer: Self-pay | Admitting: Obstetrics and Gynecology

## 2022-03-23 ENCOUNTER — Other Ambulatory Visit: Payer: Self-pay

## 2022-03-23 ENCOUNTER — Inpatient Hospital Stay (HOSPITAL_COMMUNITY)
Admission: AD | Admit: 2022-03-23 | Discharge: 2022-03-23 | Disposition: A | Discharge status: Nursing Home (Includes ICF) | Payer: No Typology Code available for payment source | Attending: Obstetrics and Gynecology | Admitting: Obstetrics and Gynecology

## 2022-03-23 DIAGNOSIS — O329XX0 Maternal care for malpresentation of fetus, unspecified, not applicable or unspecified: Secondary | ICD-10-CM | POA: Insufficient documentation

## 2022-03-23 DIAGNOSIS — O99343 Other mental disorders complicating pregnancy, third trimester: Secondary | ICD-10-CM | POA: Insufficient documentation

## 2022-03-23 DIAGNOSIS — K219 Gastro-esophageal reflux disease without esophagitis: Secondary | ICD-10-CM | POA: Insufficient documentation

## 2022-03-23 DIAGNOSIS — Z791 Long term (current) use of non-steroidal anti-inflammatories (NSAID): Secondary | ICD-10-CM | POA: Insufficient documentation

## 2022-03-23 DIAGNOSIS — O99613 Diseases of the digestive system complicating pregnancy, third trimester: Secondary | ICD-10-CM | POA: Insufficient documentation

## 2022-03-23 DIAGNOSIS — F909 Attention-deficit hyperactivity disorder, unspecified type: Secondary | ICD-10-CM | POA: Insufficient documentation

## 2022-03-23 DIAGNOSIS — O321XX0 Maternal care for breech presentation, not applicable or unspecified: Secondary | ICD-10-CM

## 2022-03-23 DIAGNOSIS — Z3A32 32 weeks gestation of pregnancy: Secondary | ICD-10-CM

## 2022-03-23 DIAGNOSIS — O4703 False labor before 37 completed weeks of gestation, third trimester: Secondary | ICD-10-CM

## 2022-03-23 LAB — URINALYSIS, ROUTINE W REFLEX MICROSCOPIC
Bilirubin Urine: NEGATIVE
Glucose, UA: NEGATIVE mg/dL
Hgb urine dipstick: NEGATIVE
Ketones, ur: 80 mg/dL — AB
Leukocytes,Ua: NEGATIVE
Nitrite: NEGATIVE
Protein, ur: NEGATIVE mg/dL
Specific Gravity, Urine: 1.016 (ref 1.005–1.030)
pH: 5 (ref 5.0–8.0)

## 2022-03-23 LAB — CBC
HCT: 34.8 % — ABNORMAL LOW (ref 36.0–46.0)
Hemoglobin: 11.5 g/dL — ABNORMAL LOW (ref 12.0–15.0)
MCH: 28.3 pg (ref 26.0–34.0)
MCHC: 33 g/dL (ref 30.0–36.0)
MCV: 85.5 fL (ref 80.0–100.0)
Platelets: 243 10*3/uL (ref 150–400)
RBC: 4.07 MIL/uL (ref 3.87–5.11)
RDW: 13.4 % (ref 11.5–15.5)
WBC: 17.7 10*3/uL — ABNORMAL HIGH (ref 4.0–10.5)
nRBC: 0.1 % (ref 0.0–0.2)

## 2022-03-23 LAB — WET PREP, GENITAL
Clue Cells Wet Prep HPF POC: NONE SEEN
Sperm: NONE SEEN
Trich, Wet Prep: NONE SEEN
WBC, Wet Prep HPF POC: <10 (ref <10)
Yeast Wet Prep HPF POC: NONE SEEN

## 2022-03-23 MED ORDER — BETAMETHASONE SOD PHOS & ACET 6 (3-3) MG/ML IJ SUSP
12.0000 mg | INTRAMUSCULAR | Status: DC
  Administered 2022-03-23: 12 mg via INTRAMUSCULAR
  Filled 2022-03-23: qty 5

## 2022-03-23 MED ORDER — MAGNESIUM SULFATE BOLUS VIA INFUSION
6.0000 g | Freq: Once | INTRAVENOUS | Status: AC
  Administered 2022-03-23: 6 g via INTRAVENOUS
  Filled 2022-03-23: qty 1000

## 2022-03-23 MED ORDER — MAGNESIUM SULFATE 40 GM/1000ML IV SOLN
2.0000 g/h | INTRAVENOUS | Status: DC
  Administered 2022-03-23: 2 g/h via INTRAVENOUS
  Filled 2022-03-23: qty 1000

## 2022-03-23 MED ORDER — NIFEDIPINE 10 MG PO CAPS
20.0000 mg | ORAL_CAPSULE | Freq: Four times a day (QID) | ORAL | Status: DC
  Administered 2022-03-23: 20 mg via ORAL
  Filled 2022-03-23: qty 2

## 2022-03-23 MED ORDER — NIFEDIPINE 10 MG PO CAPS
10.0000 mg | ORAL_CAPSULE | ORAL | Status: AC
  Administered 2022-03-23 (×3): 10 mg via ORAL
  Filled 2022-03-23 (×2): qty 1

## 2022-03-23 MED ORDER — LACTATED RINGERS IV SOLN: INTRAVENOUS | Status: DC

## 2022-03-23 MED ORDER — LACTATED RINGERS IV BOLUS
1000.0000 mL | Freq: Once | INTRAVENOUS | Status: AC
  Administered 2022-03-23: 1000 mL via INTRAVENOUS

## 2022-03-23 NOTE — MAU Note (Signed)
Patricia Kane is a 27 y.o. at [redacted]w[redacted]d here in MAU reporting: she was sent from [facility] office to evaluate for ctxs.  Reports pain is constant and in lower abdomen.  Had NST in office and "mild" ctxs noted on tracing.Denies LOF or VB.  Reports +FM, but not as much.   LMP: NA Onset of complaint: yesterday Pain score: 7 Vitals:   03/23/22 1138  BP: 113/69  Pulse: 83  Resp: 19  Temp: 98.5 F (36.9 C)  SpO2: 98%     FHT:157 bpm Lab orders placed from triage:   UA

## 2022-03-23 NOTE — MAU Provider Note (Signed)
History     CSN: [id number]  Arrival date and time: 03/23/22 1117   Event Date/Time   First Provider Initiated Contact with Patient 03/23/22 1206      Chief Complaint  Patient presents with   Contractions   27 y.o. G2P0010 @32 .6 wks sent from office for ctx. Reports onset of pain yesterday but became more painful today. Pain is constant in lower abdomen but intermittently worsens. Rates pain 7/10. Denies VB, discharge, or LOF. Reports no FM since last night.   OB History     Gravida  2   Para      Term      Preterm      AB  1   Living  0      SAB  1   IAB      Ectopic      Multiple      Live Births              Past Medical History:  Diagnosis Date   ADHD    GERD (gastroesophageal reflux disease)    Pleurisy     Past Surgical History:  Procedure Laterality Date   WISDOM TOOTH EXTRACTION      Family History  Problem Relation Age of Onset   Healthy Mother    Healthy Father    Asthma Maternal Grandfather    COPD Maternal Grandfather    Heart attack Maternal Grandfather     Social History   Tobacco Use   Smoking status: Never   Smokeless tobacco: Never  Vaping Use   Vaping Use: Never used  Substance Use Topics   Alcohol use: No   Drug use: No    Allergies:  Allergies  Allergen Reactions   Amoxicillin Swelling   Pineapple Swelling    Medications Prior to Admission  Medication Sig Dispense Refill Last Dose   folic acid (FOLVITE) 0.5 MG tablet Take 0.5 mg by mouth daily.   Past Week   Prenatal MV & Min w/FA-DHA (PRENATAL GUMMIES PO) Take by mouth.   Past Week   amphetamine-dextroamphetamine (ADDERALL XR) 25 MG 24 hr capsule Take 1 capsule by mouth every morning. (Patient not taking: Reported on 12/29/2021) 30 capsule 0    amphetamine-dextroamphetamine (ADDERALL) 30 MG tablet Take 1 tablet daily as needed for additional focus (Patient not taking: Reported on 12/29/2021) 30 tablet 0    cyclobenzaprine (FLEXERIL) 10 MG tablet Take 1  tablet (10 mg total) by mouth 3 (three) times daily as needed for muscle spasms. (Patient not taking: Reported on 02/24/2022) 30 tablet 0    diclofenac Sodium (VOLTAREN) 1 % GEL Apply 4 g topically 4 (four) times daily. (Patient not taking: Reported on 02/24/2022) 100 g 1     Review of Systems  Gastrointestinal:  Positive for abdominal pain.  Genitourinary:  Negative for vaginal bleeding and vaginal discharge.   Physical Exam   Blood pressure 113/69, pulse 83, temperature 98.5 F (36.9 C), temperature source Oral, resp. rate 19, height 5\' 5"  (1.651 m), weight 101.9 kg, last menstrual period 08/05/2021, SpO2 98 %.  Physical Exam Vitals and nursing note reviewed.  Constitutional:      General: She is in acute distress (appears uncomfortable).     Appearance: Normal appearance.  HENT:     Head: Normocephalic and atraumatic.  Cardiovascular:     Rate and Rhythm: Normal rate.  Pulmonary:     Effort: Pulmonary effort is normal. No respiratory distress.  Abdominal:  Palpations: Abdomen is soft.     Tenderness: There is no abdominal tenderness.     Comments: gravid  Genitourinary:    Comments: SVE: closed/90/0 Musculoskeletal:        General: Normal range of motion.     Cervical back: Normal range of motion.  Skin:    General: Skin is warm and dry.  Neurological:     General: No focal deficit present.     Mental Status: She is alert and oriented to person, place, and time.  Psychiatric:        Mood and Affect: Mood normal.        Behavior: Behavior normal.   EFM: 160 bpm, min variability, no accels, no decels Toco: UI  Limited bedside brech-head to maternal right, subj. nml AFV  Results for orders placed or performed during the hospital encounter of 03/23/22 (from the past 24 hour(s))  Urinalysis, Routine w reflex microscopic Urine, Clean Catch     Status: Abnormal   Collection Time: 03/23/22 11:50 AM  Result Value Ref Range   Color, Urine YELLOW YELLOW   APPearance  HAZY (A) CLEAR   Specific Gravity, Urine 1.016 1.005 - 1.030   pH 5.0 5.0 - 8.0   Glucose, UA NEGATIVE NEGATIVE mg/dL   Hgb urine dipstick NEGATIVE NEGATIVE   Bilirubin Urine NEGATIVE NEGATIVE   Ketones, ur 80 (A) NEGATIVE mg/dL   Protein, ur NEGATIVE NEGATIVE mg/dL   Nitrite NEGATIVE NEGATIVE   Leukocytes,Ua NEGATIVE NEGATIVE  Wet prep, genital     Status: None   Collection Time: 03/23/22 12:16 PM   Specimen: Vaginal  Result Value Ref Range   Yeast Wet Prep HPF POC NONE SEEN NONE SEEN   Trich, Wet Prep NONE SEEN NONE SEEN   Clue Cells Wet Prep HPF POC NONE SEEN NONE SEEN   WBC, Wet Prep HPF POC <10 <10   Sperm NONE SEEN    MAU Course  Procedures LR Procardia x3  MDM Prenatal records reviewed. 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  1335: Pt reports less ctx but still painful, SVE 0/90/0. BSUS-breech 1345: Dr. Wilhelmenia Blase notified of presentation and clinical findings, plan for admit.  Assessment and Plan  [redacted] weeks gestation Threatened PTL Fetal malpresentation Admit Mngt per Dr. Gilman Buttner, CNM 03/23/2022, 12:21 PM

## 2022-03-23 NOTE — Consult Note (Signed)
Called at 1330 to be made aware of preterm labor symptoms and known gastroschisis. OB proceeding with tocolysis and BMZ however infant breech and active contractions (no cervical dilation at this time). Given preterm labor at [redacted]w[redacted]d and known EFW of 1338g (12/13), I expressed concerns regarding surgical support at [facility]. After conversation with OB MAU team (Dr. Eula Flax) and Pediatric surgery (Dr. Windy Canny) decision made to proceed with maternal transfer while triaging preterm labor. If patient remains pregnant and delivers closer to 35 weeks, delivery at [facility]) is reasonable. However, given prematurity and potentially imminent surgical needs, delivery prior to 35 weeks would be ideally at Level 4 NICU ([facility], [facility], [place]). This information was relayed to OB/MAU team who will discuss with family.  Edman Circle, MD Attending Neonatologist

## 2022-03-23 NOTE — Progress Notes (Signed)
Patient now desires transfer to [facility]. Contacted, accepting transfer once staffing is free (critical procedure curretnly). Will fill out face sheet for EMTALA. Can D/C Mag bolus given EGA, continue EFM, procardia 20mg  q6 BP 112/68   Pulse (!) 106   Temp 98.5 F (36.9 C) (Oral)   Resp 16   Ht 5\' 5"  (1.651 m)   Wt 101.9 kg   LMP 08/05/2021   SpO2 98%   BMI 37.39 kg/m

## 2022-03-23 NOTE — Progress Notes (Signed)
Spoke with MFM Fellow Dr Casper Harrison at [facility] and NICU attending  - currently no bed available however can recheck at [facility], likely will have bed by 7PM. Will call direct for MFM or via PAL for update at 1700 BP 109/62   Pulse (!) 112   Temp 98.5 F (36.9 C) (Oral)   Resp 20   Ht 5\' 5"  (1.651 m)   Wt 101.9 kg   LMP 08/05/2021   SpO2 98%   BMI 37.39 kg/m

## 2022-03-23 NOTE — Progress Notes (Signed)
Patient resting in bed, IV lfuid running. Contractions less painful per patient, 4/10. Soft abdomen, [place] palpates breech still. Reviewed transfer plans to [facility]  BP 112/68   Pulse (!) 106   Temp 98.5 F (36.9 C) (Oral)   Resp 16   Ht 5\' 5"  (1.651 m)   Wt 101.9 kg   LMP 08/05/2021   SpO2 98%   BMI 37.39 kg/m  Min to mod variability, no accels or decels. Uterine irritability on TOCO

## 2022-03-23 NOTE — Progress Notes (Signed)
Patient seen and assessed. CE unchanged at closed but 90% with likely taut bag behind. Patient more comfortable, contractions feel more mild. +FM, no palpates transverse. Endorses q7-28m ctx at this time. Transfer team present, all questions answered BP 115/65 (BP Location: Right Arm)   Pulse (!) 106   Temp 98 F (36.7 C) (Oral)   Resp 19   Ht 5\' 5"  (1.651 m)   Wt 101.9 kg   LMP 08/05/2021   SpO2 100%   BMI 37.39 kg/m

## 2022-03-23 NOTE — Progress Notes (Signed)
Pt with [facility] transport team for transfer to [facility].

## 2022-03-24 ENCOUNTER — Encounter (HOSPITAL_COMMUNITY): Payer: Self-pay | Admitting: Obstetrics

## 2022-03-24 ENCOUNTER — Inpatient Hospital Stay (HOSPITAL_COMMUNITY)
Admission: AD | Admit: 2022-03-24 | Discharge: 2022-03-25 | DRG: 788 | Disposition: A | Discharge status: Other Acute Inpatient Hospital | Payer: No Typology Code available for payment source | Attending: Obstetrics | Admitting: Obstetrics

## 2022-03-24 DIAGNOSIS — Z3A33 33 weeks gestation of pregnancy: Secondary | ICD-10-CM

## 2022-03-24 DIAGNOSIS — O35FXX0 Maternal care for other (suspected) fetal abnormality and damage, fetal musculoskeletal anomalies of trunk, not applicable or unspecified: Secondary | ICD-10-CM | POA: Diagnosis present

## 2022-03-24 DIAGNOSIS — O321XX0 Maternal care for breech presentation, not applicable or unspecified: Secondary | ICD-10-CM | POA: Diagnosis present

## 2022-03-24 DIAGNOSIS — O42013 Preterm premature rupture of membranes, onset of labor within 24 hours of rupture, third trimester: Principal | ICD-10-CM | POA: Diagnosis present

## 2022-03-24 LAB — GC/CHLAMYDIA PROBE AMP (~~LOC~~) NOT AT ARMC
Chlamydia: NEGATIVE
Comment: NEGATIVE
Comment: NORMAL
Neisseria Gonorrhea: NEGATIVE

## 2022-03-24 LAB — RPR: RPR Ser Ql: NONREACTIVE

## 2022-03-24 MED ORDER — NIFEDIPINE 10 MG PO CAPS
10.0000 mg | ORAL_CAPSULE | ORAL | Status: DC | PRN
  Administered 2022-03-24 (×2): 10 mg via ORAL
  Filled 2022-03-24 (×3): qty 1

## 2022-03-24 NOTE — MAU Note (Signed)
..  Patricia Kane is a 27 y.o. at [redacted]w[redacted]d here in MAU reporting: contractions since 4 pm but have gotten worse and are 6 minutes apart. Denies leaking of fluid. Reports blood tinged discharge. +FM Closed and 90% effaced  Pain score: 8/10  BXU:[id number] in room 150's

## 2022-03-24 NOTE — MAU Provider Note (Incomplete)
Chief Complaint:  Contractions   Event Date/Time   First Provider Initiated Contact with Patient 03/24/22 2334     HPI  HPI: Patricia Kane is a 27 y.o. G2P0010 at 75w0dwho presents to maternity admissions reporting recurrence of preterm uterine contractions.  Was seen here last night for same and Procardia was tried unsuccessfully.  She then was given Magnesium Sulfate and Betamethasone series (got two doses) and was transferred to [facility] due to fetal gastroschisis.   They discharged her home this morning  Contractions resumed about 4pm and got stronger at 8pm.  Has had some brown/red mucous.   She reports good fetal movement, denies LOF, vaginal itching/burning, urinary symptoms, h/a, dizziness, n/v, diarrhea, constipation or fever/chills.   RN Note: Patricia Kane is a 27 y.o. at [redacted]w[redacted]d here in MAU reporting: contractions since 4 pm but have gotten worse and are 6 minutes apart. Denies leaking of fluid. Reports blood tinged discharge. +FM Closed and 90% effaced  Pain score: 8/10  Past Medical History: Past Medical History:  Diagnosis Date   ADHD    GERD (gastroesophageal reflux disease)    Pleurisy     Past obstetric history: OB History  Gravida Para Term Preterm AB Living  2       1 0  SAB IAB Ectopic Multiple Live Births  1            # Outcome Date GA Lbr Len/2nd Weight Sex Delivery Anes PTL Lv  2 Current           1 SAB             Past Surgical History: Past Surgical History:  Procedure Laterality Date   WISDOM TOOTH EXTRACTION      Family History: Family History  Problem Relation Age of Onset   Healthy Mother    Healthy Father    Asthma Maternal Grandfather    COPD Maternal Grandfather    Heart attack Maternal Grandfather     Social History: Social History   Tobacco Use   Smoking status: Never   Smokeless tobacco: Never  Vaping Use   Vaping Use: Never used  Substance Use Topics   Alcohol use: No   Drug use: No    Allergies:  Allergies  Allergen  Reactions   Amoxicillin Swelling   Pineapple Swelling    Meds:  Medications Prior to Admission  Medication Sig Dispense Refill Last Dose   folic acid (FOLVITE) 0.5 MG tablet Take 0.5 mg by mouth daily.   03/24/2022   Prenatal MV & Min w/FA-DHA (PRENATAL GUMMIES PO) Take by mouth.   03/24/2022   amphetamine-dextroamphetamine (ADDERALL XR) 25 MG 24 hr capsule Take 1 capsule by mouth every morning. (Patient not taking: Reported on 12/29/2021) 30 capsule 0    amphetamine-dextroamphetamine (ADDERALL) 30 MG tablet Take 1 tablet daily as needed for additional focus (Patient not taking: Reported on 12/29/2021) 30 tablet 0    cyclobenzaprine (FLEXERIL) 10 MG tablet Take 1 tablet (10 mg total) by mouth 3 (three) times daily as needed for muscle spasms. (Patient not taking: Reported on 02/24/2022) 30 tablet 0    diclofenac Sodium (VOLTAREN) 1 % GEL Apply 4 g topically 4 (four) times daily. (Patient not taking: Reported on 02/24/2022) 100 g 1     I have reviewed patient's Past Medical Hx, Surgical Hx, Family Hx, Social Hx, medications and allergies.   ROS:  Review of Systems  Constitutional:  Negative for chills and fever.  Eyes:  Negative for visual disturbance.  Respiratory:  Negative for shortness of breath.   Gastrointestinal:  Positive for abdominal pain. Negative for nausea.  Genitourinary:  Positive for pelvic pain and vaginal bleeding. Negative for frequency.   Other systems negative  Physical Exam  Patient Vitals for the past 24 hrs:  Temp Temp src Pulse Resp SpO2 Height Weight  03/24/22 2315 98.7 F (37.1 C) Oral 82 18 100 % 5\' 5"  (1.651 m) 102.6 kg   Constitutional: Well-developed, well-nourished female in no acute distress.   Moaning with contractions. Cardiovascular: normal rate and rhythm Respiratory: normal effort GI: Abd soft, non-tender, gravid appropriate for gestational age.   No rebound or guarding. MS: Extremities nontender, no edema, normal ROM Neurologic: Alert and  oriented x 4.  GU: Neg CVAT.  PELVIC EXAM: Loose 1cm, Bulging membranes.  Breech per bedside US Dilation: 1.5 Effacement (%): 90 Station: Ballotable Exam by:: Hansel Feinstein, CNM  FHT:  Baseline 150 , moderate variability, accelerations present, no decelerations Contractions: q 3 mins    Labs: No results found for this or any previous visit (from the past 24 hour(s)).  --/--/O POS (01/09 1501)  Imaging (Last [place]):  [place] MFM OB FOLLOW UP  Result Date: 02/24/2022 ----------------------------------------------------------------------  OBSTETRICS REPORT                       (Signed Final 02/24/2022 10:35 am) ---------------------------------------------------------------------- Patient Info  ID #:       [id number]                          D.O.B.:  1995/04/27 (26 yrs)  Name:       Patricia Kane                     Visit Date: 02/24/2022 10:22 am ---------------------------------------------------------------------- Performed By  Attending:        Sander Nephew      Ref. Address:     [address],  [address]                                                             [place], [place]                                                             [address]  Performed By:     Sandi Mealy        [facility]  Referred By:      Huel Cote MD ---------------------------------------------------------------------- Orders  #  Description                            Code        Ordered By  1  [place] MFM OB FOLLOW UP                   35009.38    Noralee Space ----------------------------------------------------------------------  #  Order #                     Accession #                Episode #  1  [id number]                   [id number]                 [id number] ---------------------------------------------------------------------- Indications  [redacted] weeks gestation of pregnancy                Z3A.29  Gastroschisis                                  O35.8XX0  LR NIPS/ Neg horizon  Encounter for other antenatal screening  Z36.2  follow-up ---------------------------------------------------------------------- Fetal Evaluation  Num Of Fetuses:         1  Fetal Heart Rate(bpm):  155  Cardiac Activity:       Observed  Presentation:           Cephalic  Placenta:               Posterior  P. Cord Insertion:      Previously Visualized  Amniotic Fluid  AFI FV:      Within normal limits  AFI Sum(cm)     %Tile       Largest Pocket(cm)  8.9             4.3         2.6  RUQ(cm)       RLQ(cm)       LUQ(cm)        LLQ(cm)  2.4           2.1           1.8            2.6 ---------------------------------------------------------------------- Biometry  BPD:      73.9  mm     G. Age:  29w 5d         59  %    CI:        68.54   %    70 - 86                                                          FL/HC:      19.4   %    19.6 - 20.8  HC:      285.3  mm     G. Age:  31w 2d         84  %    HC/AC:      1.17        0.99 - 1.21  AC:       243   mm     G. Age:  28w 4d         30  %    FL/BPD:     75.0   %    71 - 87  FL:       55.4  mm     G. Age:  29w 1d         40  %    FL/AC:      22.8   %    20 - 24  LV:       10.4  mm  Est. FW:    1338  gm    2 lb 15 oz      41  % ---------------------------------------------------------------------- OB History  Gravidity:    2          SAB:   1  Living:       0 ---------------------------------------------------------------------- Gestational Age  LMP:           29w  0d        Date:  08/05/21                  EDD:   05/12/22  U/S Today:     29w 5d  EDD:   05/07/22  Best:          29w 0d     Det. By:  LMP  (08/05/21)          EDD:   05/12/22 ---------------------------------------------------------------------- Anatomy  Cranium:               Appears normal         Aortic Arch:            Appears normal  Cavum:                 Appears normal         Ductal Arch:            Appears normal  Ventricles:            Appears normal         Diaphragm:              Appears normal  Choroid Plexus:        Previously seen        Stomach:                Appears normal, left                                                                        sided  Cerebellum:            Appears normal         Abdomen:                see comments  Posterior Fossa:       Previously seen        Abdominal Wall:         Gastroschisis  Nuchal Fold:           Not applicable (>20    Cord Vessels:           Previously seen                         wks GA)  Face:                  Orbits and profile     Kidneys:                Appear normal                         previously seen  Lips:                  Previously seen        Bladder:                Appears normal  Thoracic:              Appears normal         Spine:                  Previously seen  Heart:                 Appears normal         Upper Extremities:  Previously seen                         (4CH, axis, and                         situs)  RVOT:                  Previously seen        Lower Extremities:      Previously seen  LVOT:                  Previously seen  Other:  Female gender, Nasal bone, Hands, feet, Lenses, VC, 3VV and 3VTV          visualized previously. Technicallly difficult due to maternal habitus. 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  Normal interval growth with measurements consistent with  dates  Good  fetal movement and amniotic fluid volume  The bowel appears normal without evidence of bowel dilation. ---------------------------------------------------------------------- Recommendations  Repeat growth in 4 weeks. ----------------------------------------------------------------------               Lin Landsman, MD Electronically Signed Final Report   02/24/2022 10:35 am ----------------------------------------------------------------------   MAU Course/MDM: I have reviewed the triage vital signs and the nursing notes.   Pertinent labs & imaging results that were available during my care of the patient were reviewed by me and considered in my medical decision making (see chart for details).      I have reviewed her medical records including past results, notes and treatments.   NST reviewed, small accelerations noted, likely because her baseline is 150-155 Consult Dr Chestine Spore with presentation, exam findings and test results.  Treatments in MAU included EFM, Procardia series.    Update:  at 0000hrs ([place]), pt had gross PPROM of brown/red fluid, copious. Dr Chestine Spore Notified and will come to initiate transfer to [facility] Bedside US ordered to evaluate for abruption, negative After [place] fetus developed recurrent deep variable decels to 90s Baseline FHR 170-180.   Dr Chestine Spore at bedside throughout, ordered Urgent Cesarean Delivery  Assessment: Single IUP at [redacted]w[redacted]d Fetal Gastroschisis Preterm Labor Fetal heart rate decelerations.  Plan: Urgent Cesarean Delivery  NICU MD notified MD to follow  Wynelle Bourgeois CNM, MSN Certified Nurse-Midwife 03/24/2022 11:34 PM

## 2022-03-25 ENCOUNTER — Inpatient Hospital Stay (HOSPITAL_COMMUNITY): Payer: No Typology Code available for payment source | Admitting: Anesthesiology

## 2022-03-25 ENCOUNTER — Encounter (HOSPITAL_COMMUNITY): Payer: Self-pay | Admitting: Obstetrics

## 2022-03-25 ENCOUNTER — Other Ambulatory Visit: Payer: Self-pay

## 2022-03-25 ENCOUNTER — Inpatient Hospital Stay (HOSPITAL_COMMUNITY): Payer: No Typology Code available for payment source

## 2022-03-25 ENCOUNTER — Encounter (HOSPITAL_COMMUNITY)
Admission: AD | Disposition: A | Discharge status: Other Acute Inpatient Hospital | Payer: Self-pay | Source: Home / Self Care | Attending: Obstetrics

## 2022-03-25 DIAGNOSIS — Z3A33 33 weeks gestation of pregnancy: Secondary | ICD-10-CM

## 2022-03-25 DIAGNOSIS — O358XX0 Maternal care for other (suspected) fetal abnormality and damage, not applicable or unspecified: Secondary | ICD-10-CM

## 2022-03-25 DIAGNOSIS — O35FXX0 Maternal care for other (suspected) fetal abnormality and damage, fetal musculoskeletal anomalies of trunk, not applicable or unspecified: Secondary | ICD-10-CM | POA: Diagnosis present

## 2022-03-25 DIAGNOSIS — O321XX0 Maternal care for breech presentation, not applicable or unspecified: Secondary | ICD-10-CM

## 2022-03-25 DIAGNOSIS — O42013 Preterm premature rupture of membranes, onset of labor within 24 hours of rupture, third trimester: Secondary | ICD-10-CM | POA: Diagnosis present

## 2022-03-25 DIAGNOSIS — O4290 Premature rupture of membranes, unspecified as to length of time between rupture and onset of labor, unspecified weeks of gestation: Secondary | ICD-10-CM | POA: Diagnosis not present

## 2022-03-25 DIAGNOSIS — O42913 Preterm premature rupture of membranes, unspecified as to length of time between rupture and onset of labor, third trimester: Secondary | ICD-10-CM

## 2022-03-25 LAB — CBC
HCT: 32.9 % — ABNORMAL LOW (ref 36.0–46.0)
Hemoglobin: 10.8 g/dL — ABNORMAL LOW (ref 12.0–15.0)
MCH: 28.5 pg (ref 26.0–34.0)
MCHC: 32.8 g/dL (ref 30.0–36.0)
MCV: 86.8 fL (ref 80.0–100.0)
Platelets: 277 10*3/uL (ref 150–400)
RBC: 3.79 MIL/uL — ABNORMAL LOW (ref 3.87–5.11)
RDW: 13.6 % (ref 11.5–15.5)
WBC: 19.4 10*3/uL — ABNORMAL HIGH (ref 4.0–10.5)
nRBC: 0 % (ref 0.0–0.2)

## 2022-03-25 LAB — TYPE AND SCREEN
ABO/RH(D): O POS
ABO/RH(D): O POS
Antibody Screen: NEGATIVE
Antibody Screen: NEGATIVE

## 2022-03-25 SURGERY — Surgical Case
Anesthesia: Spinal

## 2022-03-25 MED ORDER — STERILE WATER FOR IRRIGATION IR SOLN: Status: DC | PRN

## 2022-03-25 MED ORDER — ONDANSETRON HCL 4 MG/2ML IJ SOLN
INTRAMUSCULAR | Status: DC | PRN
  Administered 2022-03-25: 4 mg via INTRAVENOUS

## 2022-03-25 MED ORDER — FENTANYL CITRATE (PF) 100 MCG/2ML IJ SOLN: 25.0000 ug | INTRAMUSCULAR | Status: DC | PRN

## 2022-03-25 MED ORDER — CLINDAMYCIN PHOSPHATE 900 MG/50ML IV SOLN
INTRAVENOUS | Status: DC | PRN
  Administered 2022-03-25: 900 mg via INTRAVENOUS

## 2022-03-25 MED ORDER — SENNOSIDES-DOCUSATE SODIUM 8.6-50 MG PO TABS: 2.0000 | ORAL_TABLET | ORAL | Status: DC

## 2022-03-25 MED ORDER — AZITHROMYCIN 250 MG PO TABS
1000.0000 mg | ORAL_TABLET | Freq: Once | ORAL | Status: DC
  Filled 2022-03-25: qty 4

## 2022-03-25 MED ORDER — PHENYLEPHRINE HCL (PRESSORS) 10 MG/ML IV SOLN
INTRAVENOUS | Status: DC | PRN
  Administered 2022-03-25 (×2): 80 ug via INTRAVENOUS

## 2022-03-25 MED ORDER — MORPHINE SULFATE (PF) 0.5 MG/ML IJ SOLN
INTRAMUSCULAR | Status: AC
  Filled 2022-03-25: qty 10

## 2022-03-25 MED ORDER — DIPHENHYDRAMINE HCL 50 MG/ML IJ SOLN: 12.5000 mg | INTRAMUSCULAR | Status: DC | PRN

## 2022-03-25 MED ORDER — DIPHENHYDRAMINE HCL 25 MG PO CAPS: 25.0000 mg | ORAL_CAPSULE | Freq: Four times a day (QID) | ORAL | Status: DC | PRN

## 2022-03-25 MED ORDER — METOCLOPRAMIDE HCL 5 MG/ML IJ SOLN
INTRAMUSCULAR | Status: DC | PRN
  Administered 2022-03-25: 10 mg via INTRAVENOUS

## 2022-03-25 MED ORDER — SODIUM CHLORIDE 0.9 % IR SOLN: Status: DC | PRN

## 2022-03-25 MED ORDER — SODIUM CHLORIDE 0.9% FLUSH: 3.0000 mL | INTRAVENOUS | Status: DC | PRN

## 2022-03-25 MED ORDER — LACTATED RINGERS IV SOLN: INTRAVENOUS | Status: DC | PRN

## 2022-03-25 MED ORDER — LACTATED RINGERS IV SOLN: INTRAVENOUS | Status: DC

## 2022-03-25 MED ORDER — MORPHINE SULFATE (PF) 0.5 MG/ML IJ SOLN
INTRAMUSCULAR | Status: DC | PRN
  Administered 2022-03-25: .15 mg via INTRATHECAL

## 2022-03-25 MED ORDER — FENTANYL CITRATE (PF) 100 MCG/2ML IJ SOLN
INTRAMUSCULAR | Status: DC | PRN
  Administered 2022-03-25: 15 ug via INTRATHECAL

## 2022-03-25 MED ORDER — KETOROLAC TROMETHAMINE 30 MG/ML IJ SOLN: 30.0000 mg | Freq: Four times a day (QID) | INTRAMUSCULAR | Status: DC | PRN

## 2022-03-25 MED ORDER — OXYTOCIN-SODIUM CHLORIDE 30-0.9 UT/500ML-% IV SOLN: 2.5000 [IU]/h | INTRAVENOUS | Status: DC

## 2022-03-25 MED ORDER — OXYTOCIN-SODIUM CHLORIDE 30-0.9 UT/500ML-% IV SOLN
INTRAVENOUS | Status: DC | PRN
  Administered 2022-03-25 (×2): 30 [IU] via INTRAVENOUS

## 2022-03-25 MED ORDER — FENTANYL CITRATE (PF) 100 MCG/2ML IJ SOLN
INTRAMUSCULAR | Status: AC
  Filled 2022-03-25: qty 2

## 2022-03-25 MED ORDER — PRENATAL MULTIVITAMIN CH: 1.0000 | ORAL_TABLET | Freq: Every day | ORAL | Status: DC

## 2022-03-25 MED ORDER — SIMETHICONE 80 MG PO CHEW: 80.0000 mg | CHEWABLE_TABLET | ORAL | Status: DC | PRN

## 2022-03-25 MED ORDER — IBUPROFEN 600 MG PO TABS: 600.0000 mg | ORAL_TABLET | Freq: Four times a day (QID) | ORAL | Status: DC

## 2022-03-25 MED ORDER — ACETAMINOPHEN 500 MG PO TABS: 1000.0000 mg | ORAL_TABLET | Freq: Four times a day (QID) | ORAL | Status: DC

## 2022-03-25 MED ORDER — CLINDAMYCIN PHOSPHATE 900 MG/50ML IV SOLN
900.0000 mg | Freq: Three times a day (TID) | INTRAVENOUS | Status: DC
  Filled 2022-03-25 (×2): qty 50

## 2022-03-25 MED ORDER — TRANEXAMIC ACID-NACL 1000-0.7 MG/100ML-% IV SOLN
INTRAVENOUS | Status: DC | PRN
  Administered 2022-03-25: 1000 mg via INTRAVENOUS

## 2022-03-25 MED ORDER — NALOXONE HCL 4 MG/10ML IJ SOLN: 1.0000 ug/kg/h | INTRAVENOUS | Status: DC | PRN

## 2022-03-25 MED ORDER — SODIUM CHLORIDE 0.9 % IV SOLN: INTRAVENOUS | Status: DC | PRN

## 2022-03-25 MED ORDER — COCONUT OIL OIL: 1.0000 | TOPICAL_OIL | Status: DC | PRN

## 2022-03-25 MED ORDER — DIBUCAINE (PERIANAL) 1 % EX OINT: 1.0000 | TOPICAL_OINTMENT | CUTANEOUS | Status: DC | PRN

## 2022-03-25 MED ORDER — KETOROLAC TROMETHAMINE 30 MG/ML IJ SOLN
30.0000 mg | Freq: Four times a day (QID) | INTRAMUSCULAR | Status: DC
  Administered 2022-03-25: 30 mg via INTRAVENOUS
  Filled 2022-03-25: qty 1

## 2022-03-25 MED ORDER — SCOPOLAMINE 1 MG/3DAYS TD PT72
MEDICATED_PATCH | TRANSDERMAL | Status: AC
  Filled 2022-03-25: qty 1

## 2022-03-25 MED ORDER — ACETAMINOPHEN 500 MG PO TABS
1000.0000 mg | ORAL_TABLET | Freq: Four times a day (QID) | ORAL | Status: DC
  Administered 2022-03-25: 1000 mg via ORAL
  Filled 2022-03-25: qty 2

## 2022-03-25 MED ORDER — DIPHENHYDRAMINE HCL 50 MG/ML IJ SOLN
INTRAMUSCULAR | Status: DC | PRN
  Administered 2022-03-25: 25 mg via INTRAVENOUS

## 2022-03-25 MED ORDER — KETOROLAC TROMETHAMINE 30 MG/ML IJ SOLN
30.0000 mg | Freq: Four times a day (QID) | INTRAMUSCULAR | Status: DC | PRN
  Administered 2022-03-25: 30 mg via INTRAVENOUS

## 2022-03-25 MED ORDER — NALOXONE HCL 0.4 MG/ML IJ SOLN: 0.4000 mg | INTRAMUSCULAR | Status: DC | PRN

## 2022-03-25 MED ORDER — PHENYLEPHRINE HCL-NACL 20-0.9 MG/250ML-% IV SOLN
INTRAVENOUS | Status: DC | PRN
  Administered 2022-03-25: 60 ug/min via INTRAVENOUS

## 2022-03-25 MED ORDER — SIMETHICONE 80 MG PO CHEW
80.0000 mg | CHEWABLE_TABLET | Freq: Three times a day (TID) | ORAL | Status: DC
  Administered 2022-03-25: 80 mg via ORAL
  Filled 2022-03-25: qty 1

## 2022-03-25 MED ORDER — WITCH HAZEL-GLYCERIN EX PADS: 1.0000 | MEDICATED_PAD | CUTANEOUS | Status: DC | PRN

## 2022-03-25 MED ORDER — DIPHENHYDRAMINE HCL 25 MG PO CAPS: 25.0000 mg | ORAL_CAPSULE | ORAL | Status: DC | PRN

## 2022-03-25 MED ORDER — SCOPOLAMINE 1 MG/3DAYS TD PT72
1.0000 | MEDICATED_PATCH | Freq: Once | TRANSDERMAL | Status: DC
  Administered 2022-03-25: 1.5 mg via TRANSDERMAL

## 2022-03-25 MED ORDER — OXYCODONE HCL 5 MG PO TABS: 5.0000 mg | ORAL_TABLET | ORAL | Status: DC | PRN

## 2022-03-25 MED ORDER — ONDANSETRON HCL 4 MG/2ML IJ SOLN: 4.0000 mg | Freq: Three times a day (TID) | INTRAMUSCULAR | Status: DC | PRN

## 2022-03-25 MED ORDER — KETOROLAC TROMETHAMINE 30 MG/ML IJ SOLN
INTRAMUSCULAR | Status: AC
  Filled 2022-03-25: qty 1

## 2022-03-25 MED ORDER — KETOROLAC TROMETHAMINE 30 MG/ML IJ SOLN: 30.0000 mg | Freq: Once | INTRAMUSCULAR | Status: DC | PRN

## 2022-03-25 MED ORDER — CLINDAMYCIN PHOSPHATE 900 MG/50ML IV SOLN
INTRAVENOUS | Status: AC
  Filled 2022-03-25: qty 50

## 2022-03-25 MED ORDER — BUPIVACAINE IN DEXTROSE 0.75-8.25 % IT SOLN
INTRATHECAL | Status: DC | PRN
  Administered 2022-03-25: 1.6 mL via INTRATHECAL

## 2022-03-25 MED ORDER — GENTAMICIN SULFATE 40 MG/ML IJ SOLN
5.0000 mg/kg | INTRAVENOUS | Status: DC
  Administered 2022-03-25: 380 mg via INTRAVENOUS
  Filled 2022-03-25: qty 9.5

## 2022-03-25 MED ORDER — MENTHOL 3 MG MT LOZG: 1.0000 | LOZENGE | OROMUCOSAL | Status: DC | PRN

## 2022-03-25 SURGICAL SUPPLY — 39 items
APL PRP STRL LF DISP 70% ISPRP (MISCELLANEOUS) ×2
APL SKNCLS STERI-STRIP NONHPOA (GAUZE/BANDAGES/DRESSINGS) ×1
BENZOIN TINCTURE PRP APPL 2/3 (GAUZE/BANDAGES/DRESSINGS) ×1 IMPLANT
CHLORAPREP W/TINT 26 (MISCELLANEOUS) ×2 IMPLANT
CLAMP UMBILICAL CORD (MISCELLANEOUS) ×1 IMPLANT
CLOTH BEACON ORANGE TIMEOUT ST (SAFETY) ×1 IMPLANT
DRSG OPSITE POSTOP 4X10 (GAUZE/BANDAGES/DRESSINGS) ×1 IMPLANT
ELECT REM PT RETURN 9FT ADLT (ELECTROSURGICAL) ×1
ELECTRODE REM PT RTRN 9FT ADLT (ELECTROSURGICAL) ×1 IMPLANT
EXTRACTOR VACUUM KIWI (MISCELLANEOUS) IMPLANT
GAUZE SPONGE 4X4 12PLY STRL LF (GAUZE/BANDAGES/DRESSINGS) IMPLANT
GLOVE BIOGEL M STER SZ 6 (GLOVE) ×1 IMPLANT
GLOVE BIOGEL PI IND STRL 6 (GLOVE) ×1 IMPLANT
GLOVE BIOGEL PI IND STRL 7.0 (GLOVE) ×1 IMPLANT
GOWN STRL REUS W/TWL LRG LVL3 (GOWN DISPOSABLE) ×2 IMPLANT
KIT ABG SYR 3ML LUER SLIP (SYRINGE) IMPLANT
MAT PREVALON FULL STRYKER (MISCELLANEOUS) IMPLANT
NDL HYPO 25X5/8 SAFETYGLIDE (NEEDLE) IMPLANT
NEEDLE HYPO 25X5/8 SAFETYGLIDE (NEEDLE) IMPLANT
NS IRRIG 1000ML POUR BTL (IV SOLUTION) ×1 IMPLANT
PACK C SECTION WH (CUSTOM PROCEDURE TRAY) ×1 IMPLANT
PAD ABD 7.5X8 STRL (GAUZE/BANDAGES/DRESSINGS) IMPLANT
PAD OB MATERNITY 4.3X12.25 (PERSONAL CARE ITEMS) ×1 IMPLANT
RTRCTR C-SECT PINK 25CM LRG (MISCELLANEOUS) ×1 IMPLANT
STRIP CLOSURE SKIN 1/2X4 (GAUZE/BANDAGES/DRESSINGS) ×1 IMPLANT
SUT MNCRL 0 VIOLET CTX 36 (SUTURE) ×2 IMPLANT
SUT MONOCRYL 0 CTX 36 (SUTURE) ×2
SUT PLAIN 0 NONE (SUTURE) IMPLANT
SUT PLAIN 2 0 (SUTURE) ×1
SUT PLAIN ABS 2-0 CT1 27XMFL (SUTURE) ×1 IMPLANT
SUT VIC AB 0 CTX 36 (SUTURE) ×2
SUT VIC AB 0 CTX36XBRD ANBCTRL (SUTURE) ×2 IMPLANT
SUT VIC AB 2-0 CT1 27 (SUTURE) ×1
SUT VIC AB 2-0 CT1 TAPERPNT 27 (SUTURE) ×1 IMPLANT
SUT VICRYL 4-0 PS2 18IN ABS (SUTURE) ×1 IMPLANT
TOWEL OR 17X24 6PK STRL BLUE (TOWEL DISPOSABLE) ×1 IMPLANT
TRAY FOLEY W/BAG SLVR 14FR LF (SET/KITS/TRAYS/PACK) ×1 IMPLANT
WATER STERILE IRR 1000ML POUR (IV SOLUTION) ×1 IMPLANT
YANKAUER SUCT BULB TIP NO VENT (SUCTIONS) IMPLANT

## 2022-03-25 NOTE — Progress Notes (Signed)
Pharmacy Antibiotic Note  Patricia Kane is a 27 y.o. female admitted on 03/24/2022 with contracitons and PPROM. Pharmacy has been consulted for gentamicin dosing.  Plan: Gentamicin 5mg /kg Q24 hrs x 2 doses  Height: 5\' 5"  (165.1 cm) Weight: 102.6 kg (226 lb 4.8 oz) IBW/kg (Calculated) : 57  Temp (24hrs), Avg:98.7 F (37.1 C), Min:98.7 F (37.1 C), Max:98.7 F (37.1 C)  Recent Labs  Lab 03/23/22 1502  WBC 17.7*    CrCl cannot be calculated (Patient's most recent lab result is older than the maximum 21 days allowed.).    Allergies  Allergen Reactions   Amoxicillin Swelling   Pineapple Swelling    Antimicrobials this admission: Azithro 1g x 1/11 Clindamycin 900 mg  1/11 >>    Thank you for allowing pharmacy to be a part of this patient's care.  Nyra Capes 03/25/2022 1:04 AM

## 2022-03-25 NOTE — Op Note (Signed)
Cesarean Section Procedure Note  Pre-operative Diagnosis: 1. Intrauterine pregnancy at [redacted]w[redacted]d  2. Breech presentation 3. PPROM 4. Fetal gastroschisis  5.  Non-reassuring fetal status  Post-operative Diagnosis: same as above  Surgeon: Jerelyn Charles, MD  Assistants: Liliane Channel  Procedure: Primary low transverse cesarean section   Anesthesia: Spinal anesthesia  Estimated Blood Loss: 799 mL         Drains: Foley catheter         Specimens: placenta to pathology              Complications:  None; patient tolerated the procedure well.         Disposition: PACU - hemodynamically stable.  Findings:  Normal uterus, tubes and ovaries bilaterally.  Viable female infant, 1940g (4lb 4.4oz) Apgars 7, 9.   The cord was darkly stained.  There was no obvious evidence of old clot in the uterus or placental abruption. Fetal bowel was dusky upon delivery. Cord gas unable to be obtained  Procedure Details   After spinal  anesthesia was found to adequate, the patient was placed in the dorsal supine position with a leftward tilt, prepped and draped in the usual sterile manner. A Pfannenstiel incision was made and carried down through the subcutaneous tissue to the fascia.  The fascia was incised in the midline and the fascial incision was extended laterally with Mayo scissors. The superior aspect of the fascial incision was grasped with two Kocher clamps, tented up and the rectus muscles dissected off sharply. The rectus was then dissected off with blunt dissection and Mayo scissors inferiorly. The rectus muscles were separated in the midline. The abdominal peritoneum was identified, tented up, entered bluntly, and the incision was extended superiorly and inferiorly with good visualization of the bladder. The Alexis retractor was deployed. The vesicouterine peritoneum was identified. A scalpel was then used to make a low transverse incision on the uterus which was extended in the cephalad-caudad direction  with blunt dissection. The fluid was stained dark brown. The fetal breech was identified, elevated out of the pelvis and brought to the hysterotomy.  Following delivery of the torso, a wet blue towel was used to gently wrap the abdomen and free bowel.  The arms and head were then delivered easily via the usual breech maneuvers.  The cord was stained dark brown.  The cord clamped and cut after 30 seconds due to minimal respiratory effort and the infant was passed to the waiting neonatologist.  The placenta was then delivered spontaneously, intact and appear normal, the uterus was cleared of all clot and debris.  Attempt to obtain a cord gas was unsuccessful.    The hysterotomy was repaired with #0 Monocryl in running locked fashion.  A second imbricating layer of #0 Monocryl was placed.  The uterus was boggy during repair so 1 gm TXA was administered and tone quickly improved.  The Alexis retractor was removed from the abdomen. The peritoneum was examined and all vessels noted to be hemostatic. The abdominal cavity was cleared of all clot and debris.  The peritoneum was closed with 2-0 vicryl in a running fashion.  The rectus muscles were then closed with 2-0 Vicryl. The fascia and rectus muscles were inspected and were hemostatic. The fascia was closed with 0 Vicryl in a running fashion. The subcutaneous layer was irrigated and all bleeders cauterized. The subcutaneous layer was closed with interrupted plain gut. The skin was closed with 4-0 vicryl in a subcuticular fashion. The incision was dressed with  benzoine, steri strips and honeycomb dressing.  A pressure dressing was then placed.  All sponge lap and needle counts were correct x3. Patient tolerated the procedure well and recovered in stable condition following the procedure.   An experienced assistant was required given the standard of surgical care given the complexity of the case.  This assistant was needed for exposure, dissection, suctioning,  retraction, instrument exchange,  assisting with delivery with administration of fundal pressure, and for overall help during the procedure.

## 2022-03-25 NOTE — Transfer of Care (Signed)
Immediate Anesthesia Transfer of Care Note  Patient: Patricia Kane  Procedure(s) Performed: CESAREAN SECTION  Patient Location: PACU  Anesthesia Type:Spinal  Level of Consciousness: awake, alert , and oriented  Airway & Oxygen Therapy: Patient Spontanous Breathing  Post-op Assessment: Report given to RN and Post -op Vital signs reviewed and stable  Post vital signs: Reviewed and stable  Last Vitals:  Vitals Value Taken Time  BP 90/59 03/25/22 0217  Temp    Pulse 87 03/25/22 0220  Resp 21 03/25/22 0220  SpO2 100 % 03/25/22 0220  Vitals shown include unvalidated device data.  Last Pain:  Vitals:   03/25/22 0005  TempSrc:   PainSc: 8          Complications: No notable events documented.

## 2022-03-25 NOTE — Anesthesia Postprocedure Evaluation (Signed)
Anesthesia Post Note  Patient: Patricia Kane  Procedure(s) Performed: CESAREAN SECTION     Patient location during evaluation: PACU Anesthesia Type: Spinal Level of consciousness: oriented and awake and alert Pain management: pain level controlled Vital Signs Assessment: post-procedure vital signs reviewed and stable Respiratory status: spontaneous breathing, respiratory function stable and patient connected to nasal cannula oxygen Cardiovascular status: blood pressure returned to baseline and stable Postop Assessment: no headache, no backache and no apparent nausea or vomiting Anesthetic complications: no  No notable events documented.  Last Vitals:  Vitals:   03/25/22 0804 03/25/22 0958  BP: 117/60 113/71  Pulse: 84 77  Resp: 18 19  Temp: 37.4 C 36.9 C  SpO2: 99% 100%    Last Pain:  Vitals:   03/25/22 0958  TempSrc: Oral  PainSc:    Pain Goal:                   [name] [name]

## 2022-03-25 NOTE — Discharge Summary (Signed)
Postpartum Discharge Summary       Patient Name: Patricia Kane DOB: 1995/12/22 MRN: [id number]  Date of admission: 03/24/2022 Delivery date:03/25/2022  Delivering provider: Jerelyn Charles  Date of discharge: 03/25/2022  Admitting diagnosis: Preterm premature rupture of membranes (PPROM) with onset of labor within 24 hours of rupture in third trimester, antepartum [O42.013] Intrauterine pregnancy: [redacted]w[redacted]d     Secondary diagnosis:  Principal Problem:   Preterm premature rupture of membranes (PPROM) with onset of labor within 24 hours of rupture in third trimester, antepartum    Discharge diagnosis:  P PROM at 33+1.  Emergency cesarean delivery due to nonreassuring fetal status.                                               Post partum procedures: NA Augmentation: N/A Complications: None  Hospital course: Onset of Labor With Unplanned C/S   27 y.o. yo G2P0111 at [redacted]w[redacted]d was admitted in [place] on 03/24/2022. Patient had a labor course significant for P PROM, nonreassuring fetal status requiring emergency C-section. The patient went for cesarean section due to Non-Reassuring FHR. Delivery details as follows: Membrane Rupture Time/Date: 12:00 AM ,03/25/2022   Delivery Method:C-Section, Low Transverse  Details of operation can be found in separate operative note. Patient had a postpartum course complicated by nothing.  She is ambulating,tolerating a regular diet, passing flatus, and urinating well.  Patient is discharged home in stable condition 03/25/22.  Newborn Data: Birth date:03/25/2022  Birth time:1:28 AM  Gender:Female  Living status:Living  Apgars:7 ,9  Weight:1940 g   Magnesium Sulfate received: No BMZ received: No Rhophylac:N/A  Physical exam  Vitals:   03/25/22 0302 03/25/22 0356 03/25/22 0452 03/25/22 0804  BP: (!) 99/58 113/61 112/61 117/60  Pulse: 92 (!) 106 86 84  Resp: (!) 24 19 17 18   Temp: 99.8 F (37.7 C) 99.6 F (37.6 C)  99.4 F (37.4 C)  TempSrc: Oral Oral   Oral  SpO2: 98% 98%  99%  Weight:      Height:       General: alert, cooperative, and no distress Lochia: appropriate Uterine Fundus: firm Incision: Healing well with no significant drainage DVT Evaluation: No evidence of DVT seen on physical exam. Labs: Lab Results  Component Value Date   WBC 19.4 (H) 03/25/2022   HGB 10.8 (L) 03/25/2022   HCT 32.9 (L) 03/25/2022   MCV 86.8 03/25/2022   PLT 277 03/25/2022      Latest Ref Rng & Units 06/29/2019    3:27 PM  CMP  Glucose 65 - 99 mg/dL 78   BUN 7 - 25 mg/dL 7   Creatinine 0.50 - 1.10 mg/dL 0.74   Sodium 135 - 146 mmol/L 139   Potassium 3.5 - 5.3 mmol/L 4.4   Chloride 98 - 110 mmol/L 104   CO2 20 - 32 mmol/L 25   Calcium 8.6 - 10.2 mg/dL 9.8   Total Protein 6.1 - 8.1 g/dL 7.2   Total Bilirubin 0.2 - 1.2 mg/dL 0.3   AST 10 - 30 U/L 14   ALT 6 - 29 U/L 7    Edinburgh Score:     No data to display            After visit meds:     Discharge home in stable condition  Infant Disposition: NICU transfer to  [facility] Discharge instruction: per After Visit Summary and Postpartum booklet. Activity: Advance as tolerated. Pelvic rest for 6 weeks.  Diet: routine diet Anticipated Birth Control: Unsure Postpartum Appointment:2 weeks  Future Appointments: Future Appointments  Date Time Provider [place]  03/30/2022 10:30 AM [facility] NURSE [facility]  03/30/2022 10:45 AM [facility] US5 [facility]  04/21/2022  8:15 AM [facility] NURSE [facility]  04/21/2022  8:30 AM [facility] US2 [facility] [place]   Follow up Visit:      03/25/2022 Vanessa Kick, MD

## 2022-03-25 NOTE — Anesthesia Procedure Notes (Signed)
Spinal  Patient location during procedure: OR Start time: 03/25/2022 1:00 AM End time: 03/25/2022 1:10 AM Reason for block: surgical anesthesia Staffing Performed: anesthesiologist  Anesthesiologist: Freddrick March, MD Performed by: Freddrick March, MD Authorized by: Freddrick March, MD   Preanesthetic Checklist Completed: patient identified, IV checked, risks and benefits discussed, surgical consent, monitors and equipment checked, pre-op evaluation and timeout performed Spinal Block Patient position: sitting Prep: DuraPrep and site prepped and draped Patient monitoring: cardiac monitor, continuous pulse ox and blood pressure Approach: midline Location: L3-4 Injection technique: single-shot Needle Needle type: Pencan and Tuohy  Needle gauge: 25 G Needle length: 9 cm Assessment Sensory level: T6 Events: CSF return Additional Notes Functioning IV was confirmed and monitors were applied. Sterile prep and drape, including hand hygiene and sterile gloves were used. The patient was positioned and the spine was prepped. The skin was anesthetized with lidocaine.  Free flow of clear CSF was obtained prior to injecting local anesthetic into the CSF.  The spinal needle aspirated freely following injection.  The needle was carefully withdrawn.  The patient tolerated the procedure well.   2 attempts. First attempt at L4/5 with introducer and 24G Pencan. Unable to obtain CSF. Second attempt at L3/4 with Tuohy advanced into epidural space. 25G Pencan advanced into intrathecal space with return of CSF.

## 2022-03-25 NOTE — Anesthesia Preprocedure Evaluation (Addendum)
Anesthesia Evaluation  Patient identified by MRN, date of birth, ID band Patient awake    Reviewed: Allergy & Precautions, NPO status , Patient's Chart, lab work & pertinent test results  Airway Mallampati: II  TM Distance: >3 FB Neck ROM: Full    Dental no notable dental hx.    Pulmonary neg pulmonary ROS   Pulmonary exam normal breath sounds clear to auscultation       Cardiovascular negative cardio ROS Normal cardiovascular exam Rhythm:Regular Rate:Normal     Neuro/Psych negative neurological ROS  negative psych ROS   GI/Hepatic Neg liver ROS,GERD  ,,  Endo/Other  negative endocrine ROS    Renal/GU negative Renal ROS  negative genitourinary   Musculoskeletal negative musculoskeletal ROS (+)    Abdominal   Peds  Hematology negative hematology ROS (+)   Anesthesia Other Findings G2P0 at [redacted]w[redacted]d gestation presents for C/S for PPROM, breech presentation, and non-reassuring FHT. 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   Reproductive/Obstetrics (+) Pregnancy                             Anesthesia Physical Anesthesia Plan  ASA: 2 and emergent  Anesthesia Plan: Spinal   Post-op Pain Management:    Induction:   PONV Risk Score and Plan: 2 and Treatment may vary due to age or medical condition  Airway Management Planned: Natural Airway  Additional Equipment:   Intra-op Plan:   Post-operative Plan:   Informed Consent: I have reviewed the patients History and Physical, chart, labs and discussed the procedure including the risks, benefits and alternatives for the proposed anesthesia with the patient or authorized representative who has indicated his/her understanding and acceptance.     Dental advisory given  Plan Discussed with: CRNA  Anesthesia Plan Comments:        Anesthesia Quick Evaluation

## 2022-03-25 NOTE — H&P (Signed)
27 y.o. G2P0010 @ [redacted]w[redacted]d presents with contractions.  SHe was admitted at [facility] 1/9 to 1/10 for preterm labor and discharged this afternoon. Contractions returned.  In MAU she had gross rupture of membranes followed by resultant fetal tachycardia and recurrent variable decelerations.    Past Medical History:  Diagnosis Date   ADHD    GERD (gastroesophageal reflux disease)    Pleurisy     Past Surgical History:  Procedure Laterality Date   WISDOM TOOTH EXTRACTION      OB History  Gravida Para Term Preterm AB Living  2       1 0  SAB IAB Ectopic Multiple Live Births  1            # Outcome Date GA Lbr Len/2nd Weight Sex Delivery Anes PTL Lv  2 Current           1 SAB             Social History   Socioeconomic History   Marital status: Married    Spouse name: Not on file   Number of children: Not on file   Years of education: Not on file   Highest education level: Not on file  Occupational History   Not on file  Tobacco Use   Smoking status: Never   Smokeless tobacco: Never  Vaping Use   Vaping Use: Never used  Substance and Sexual Activity   Alcohol use: No   Drug use: No   Sexual activity: Yes    Birth control/protection: Pill  Other Topics Concern   Not on file  Social History Narrative   Not on file   Social Determinants of Health   Financial Resource Strain: Not on file  Food Insecurity: Not on file  Transportation Needs: Not on file  Physical Activity: Not on file  Stress: Not on file  Social Connections: Not on file  Intimate Partner Violence: Not on file   Amoxicillin and Pineapple      Vitals:   03/24/22 2339 03/24/22 2359  BP: 122/72 119/70  Pulse:    Resp:    Temp:    SpO2:        A/P   27 y.o. G2P0010 [redacted]w[redacted]d presents with PPROM and NRFS in setting of gastroschisis  To OR for urgent cesarean delivery.   Clinda and gent for swelling to pcn NICU/OR team aware  We discussed the risks to cesarean section to include infection, bleeding,  damage to surrounding structures (including but not limited to bowel, bladder, tubes, ovaries, nerves, vessels, baby), need for blood transfusion, venous thromboembolism, need for additional procedures.    [place]

## 2022-03-25 NOTE — Progress Notes (Signed)
Due to the urgent nature of cesarean delivery, the initial H&P had limited information.  To summarize,   27 y.o. S0Y3016 @ [redacted]w[redacted]d presents with preterm contractions.  She has a history significant for fetal gastroschisis.  She was seen yesterday afternoon for preterm contractions.  Cervix was 0/90/0.  At the time of presentation, NICU and pediatric surgery were consulted and given gestational age, they recommended transfer to a level 4 NICU.  She received betamethasone and magnesium for tocolysis and was transferred to [facility].  Her contractions spaced at [facility] and she was monitored overnight. Magnesium was discontinued on arrival. NST was non-reactive, but BPP was 8/10 and confirmed breech presentation with AFI 19.     Otherwise has good fetal movement and no bleeding.  Her contractions spaced. She was discharged on the afternoon of 1/10 after received her second dose of betamethasone.   Shortly after arrival home, contractions returned and increased in intensity and frequency.    Upon arrival to MAU, she was noted to be contracting q3-4 minutes.  Her cervix was changed to 1.5/90/0 per CNM.  She received IVF and procardia.  She then had gross rupture of membranes of a large volume of brown tinged fluid.  CNM ordered urgent bedside scan that showed oligohydramnios but was otherwise unremarkable per verbal report.  Following rupture of membranes and bedside ultrasound, fetal heart rate baseline increased to the 170s with variable decelerations to the 90s.     She was quickly consented for an urgent cesarean delivery for non-reassuring fetal status as she was not stable for transfer to [facility].   Prenatal Transfer Tool  Maternal Diabetes: No Genetic Screening: Normal low risk NIPT Maternal Ultrasounds/Referrals: Other: gastroschisis Fetal Ultrasounds or other Referrals:  Referred to Materal Fetal Medicine  Maternal Substance Abuse:  No Significant Maternal Medications:  Meds include: Other: adderall Significant  Maternal Lab Results: None  ABO, Rh: --/--/O POS (01/11 0015) Antibody: NEG (01/11 0015)       [place]

## 2022-03-25 NOTE — Progress Notes (Signed)
Subjective: Postpartum Day 0: Cesarean Delivery Patient reports pain controlled, no nausea vomiting, tolerating p.o. Foley catheter in situ  Objective: Vital signs in last 24 hours: Temp:  [98.6 F (37 C)-99.8 F (37.7 C)] 99.4 F (37.4 C) (01/11 0804) Pulse Rate:  [82-106] 84 (01/11 0804) Resp:  [12-25] 18 (01/11 0804) BP: (90-126)/(56-114) 117/60 (01/11 0804) SpO2:  [95 %-100 %] 99 % (01/11 0804) Weight:  [102.6 kg] 102.6 kg (01/10 2315)  Physical Exam:  General: alert, cooperative, and appears stated age Lochia: appropriate Uterine Fundus: firm Incision: healing well DVT Evaluation: No evidence of DVT seen on physical exam.  Recent Labs    03/23/22 1502 03/25/22 0042  HGB 11.5* 10.8*  HCT 34.8* 32.9*    Assessment/Plan: Status post Cesarean section. Doing well postoperatively.  Baby transferred to NICU at [facility].  Baby diagnosed with necrotic bowel from stomach to anus.  Given infant's tenuous status decision has been made to transfer the patient to postpartum unit at [facility] for continued care.Marland Kitchen  Vanessa Kick, MD 03/25/2022, 9:26 AM

## 2022-03-29 LAB — SURGICAL PATHOLOGY

## 2022-03-30 ENCOUNTER — Ambulatory Visit: Payer: No Typology Code available for payment source

## 2022-04-01 ENCOUNTER — Telehealth (HOSPITAL_COMMUNITY): Payer: Self-pay | Admitting: *Deleted

## 2022-04-01 NOTE — Telephone Encounter (Signed)
Attempted Hospital Discharge Follow-Up Call.  Patient is at [place] with her baby and has family visiting at this time.  She requests a call back in a couple of hours.  Kennith Center, RN 04/01/21 13:36

## 2022-04-01 NOTE — Telephone Encounter (Signed)
Hospital Discharge Follow-Up Call:  Phoned patient back and she was able to complete the call.  She reports that physically she is doing well and she has no concerns about her healing process.  Her baby is in the NICU at [facility] and she is struggling emotionally due to worry about baby's health.  EPDS today was 19 (question 10 was zero) which she says accurately reflects her current mental state.  She said that she was started on Zoloft in the hospital and does not think the medication is working.  She understands that it takes several weeks for this class of medication to reach full effectiveness, but she feels she needs something else to help with her immediate symptoms.  Will notify Dr. Marvel Plan of above information via fax following department protocol for high EPDS scores.  Kennith Center, RN 04-01-22  15:58

## 2022-04-14 ENCOUNTER — Encounter: Payer: Self-pay | Admitting: Family Medicine

## 2022-04-14 ENCOUNTER — Telehealth (INDEPENDENT_AMBULATORY_CARE_PROVIDER_SITE_OTHER): Payer: No Typology Code available for payment source | Admitting: Family Medicine

## 2022-04-14 VITALS — Wt 198.0 lb

## 2022-04-14 DIAGNOSIS — Z634 Disappearance and death of family member: Secondary | ICD-10-CM

## 2022-04-14 MED ORDER — ALPRAZOLAM 1 MG PO TABS: 1.0000 mg | ORAL_TABLET | Freq: Two times a day (BID) | ORAL | 0 refills | Status: DC | PRN

## 2022-04-14 NOTE — Progress Notes (Signed)
   Virtual Visit via Video   I connected with patient on 04/14/22 at  9:40 AM EST by a video enabled telemedicine application and verified that I am speaking with the correct person using two identifiers.  Location patient: Home Location provider: [facility], Office Persons participating in the virtual visit: Patient, Provider, Ovando Marcille Blanco C)  I discussed the limitations of evaluation and management by telemedicine and the availability of in person appointments. The patient expressed understanding and agreed to proceed.  Subjective:   HPI:   Infant loss- pt went into preterm labor at 33 weeks.  Son had known gastroschisis.  He was born w/ completely necrotic bowel and it was inoperable.  Baby was placed on palliative care and he passed after 11 days.  Not currently doing grief counseling.  Had OB appt yesterday and was provided resources.  Was started on Sertraline 25mg  daily while in the hospital but this was not effective.  OB increased dose to 50mg  daily.  Currently having panic attacks and nightmares despite Alprazolam 0.5mg  BID.  Yesterday OB increased dose of Sertraline to 75mg  daily.  ROS:   See pertinent positives and negatives per HPI.  Patient Active Problem List   Diagnosis Date Noted   Preterm premature rupture of membranes (PPROM) with onset of labor within 24 hours of rupture in third trimester, antepartum 03/25/2022   ADHD (attention deficit hyperactivity disorder), inattentive type 11/21/2019   Thyromegaly 06/29/2019   Obesity (BMI 30-39.9) 06/29/2019   Encounter for birth control pills maintenance 06/29/2019   GANGLION-HAND/WRIST 11/05/2009    Social History   Tobacco Use   Smoking status: Never   Smokeless tobacco: Never  Substance Use Topics   Alcohol use: No    Current Outpatient Medications:    ALPRAZolam (XANAX) 0.5 MG tablet, Take 1 tablet by mouth 2 (two) times daily as needed., Disp: , Rfl:    sertraline (ZOLOFT) 25 MG tablet, Take 25 mg by  mouth daily., Disp: , Rfl:    sertraline (ZOLOFT) 50 MG tablet, Take 1 tablet by mouth daily., Disp: , Rfl:   Allergies  Allergen Reactions   Amoxicillin Swelling   Pineapple Swelling    Objective:   Wt 198 lb (89.8 kg)   LMP 08/05/2021   BMI 32.95 kg/m  AAOx3, NAD NCAT, EOMI No obvious CN deficits Coloring WNL Pt is able to speak clearly, coherently without shortness of breath or increased work of breathing.  Thought process is linear.  Mood is appropriate.   Assessment and Plan:   Infant loss- new.  Pt is dealing w/ the tragic loss of her infant son.  He was born prematurely at 24 weeks but had necrotic bowel that was inoperable and not compatible w/ life.  She got to spend 11 days with him before he passed.  Now she is grieving while recovering from a c-section.  OB just increased Sertraline dose yesterday to 75mg  daily.  Pt is having panic attacks and 'torturous nightmares'.  Will increase her Alprazolam dose to 1mg  BID until the higher dose of Sertraline is in her system.  Has follow up w/ OB in 2 weeks, will plan to f/u w/ her 2 weeks after that.  Pt expressed understanding and is in agreement w/ plan.    Annye Asa, MD 04/14/2022

## 2022-04-15 ENCOUNTER — Encounter (INDEPENDENT_AMBULATORY_CARE_PROVIDER_SITE_OTHER): Payer: Self-pay

## 2022-04-21 ENCOUNTER — Ambulatory Visit: Payer: No Typology Code available for payment source

## 2022-06-02 ENCOUNTER — Ambulatory Visit: Payer: No Typology Code available for payment source | Admitting: Family Medicine

## 2022-06-02 ENCOUNTER — Encounter: Payer: Self-pay | Admitting: Family Medicine

## 2022-06-02 VITALS — BP 116/80 | HR 85 | Temp 98.1°F | Resp 17 | Ht 65.0 in | Wt 210.4 lb

## 2022-06-02 DIAGNOSIS — F900 Attention-deficit hyperactivity disorder, predominantly inattentive type: Secondary | ICD-10-CM | POA: Diagnosis not present

## 2022-06-02 DIAGNOSIS — E010 Iodine-deficiency related diffuse (endemic) goiter: Secondary | ICD-10-CM

## 2022-06-02 DIAGNOSIS — Z634 Disappearance and death of family member: Secondary | ICD-10-CM

## 2022-06-02 MED ORDER — AMPHETAMINE-DEXTROAMPHET ER 20 MG PO CP24: 20.0000 mg | ORAL_CAPSULE | ORAL | 0 refills | Status: DC

## 2022-06-02 MED ORDER — FLUOXETINE HCL 20 MG PO CAPS: 20.0000 mg | ORAL_CAPSULE | Freq: Every day | ORAL | 3 refills | Status: DC

## 2022-06-02 MED ORDER — AMPHETAMINE-DEXTROAMPHETAMINE 30 MG PO TABS: ORAL_TABLET | ORAL | 0 refills | Status: DC

## 2022-06-02 MED ORDER — ALPRAZOLAM 1 MG PO TABS: 1.0000 mg | ORAL_TABLET | Freq: Two times a day (BID) | ORAL | 0 refills | Status: DC | PRN

## 2022-06-02 NOTE — Patient Instructions (Addendum)
Follow up in 1 month to recheck mood STOP the Sertraline START the Fluoxetine once daily Continue to use the Alprazolam as needed We'll call you to schedule your thyroid ultrasound I'm so glad that you're going to start counseling- you deserve it!!! Hang in there!!!  You are stronger than you know!!!

## 2022-06-02 NOTE — Progress Notes (Signed)
   Subjective:    Patient ID: Patricia Kane, female    DOB: 1995-05-23, 27 y.o.   MRN: [id number]  HPI Loss of infant- pt has gained 12 lbs since last visit.  'i'm anxious all the time.  Crying all the time.  I'm still having trouble sleeping'.  Starts counseling on 4/4.  Pt is currently on Sertraline 50mg  daily but this causes nausea.  Has not been on any other medications.  Pt is returning to work in 3 weeks and would like to restart Adderall prior to return.     Review of Systems For ROS see HPI     Objective:   Physical Exam Vitals reviewed.  Constitutional:      General: She is not in acute distress.    Appearance: Normal appearance. She is well-developed. She is not ill-appearing.  HENT:     Head: Normocephalic and atraumatic.  Eyes:     Conjunctiva/sclera: Conjunctivae normal.     Pupils: Pupils are equal, round, and reactive to light.  Neck:     Thyroid: Thyromegaly present.  Cardiovascular:     Rate and Rhythm: Normal rate and regular rhythm.     Heart sounds: Normal heart sounds. No murmur heard. Pulmonary:     Effort: Pulmonary effort is normal. No respiratory distress.     Breath sounds: Normal breath sounds.  Abdominal:     General: There is no distension.     Palpations: Abdomen is soft.     Tenderness: There is no abdominal tenderness.  Musculoskeletal:     Cervical back: Normal range of motion and neck supple.  Lymphadenopathy:     Cervical: No cervical adenopathy.  Skin:    General: Skin is warm and dry.  Neurological:     General: No focal deficit present.     Mental Status: She is alert and oriented to person, place, and time.  Psychiatric:        Mood and Affect: Mood normal.        Behavior: Behavior normal.           Assessment & Plan:

## 2022-06-10 ENCOUNTER — Ambulatory Visit (HOSPITAL_COMMUNITY)
Admission: RE | Admit: 2022-06-10 | Discharge: 2022-06-10 | Disposition: A | Discharge status: Home / Self Care | Payer: No Typology Code available for payment source | Source: Ambulatory Visit | Attending: Family Medicine | Admitting: Family Medicine

## 2022-06-10 DIAGNOSIS — E010 Iodine-deficiency related diffuse (endemic) goiter: Secondary | ICD-10-CM | POA: Diagnosis not present

## 2022-06-15 ENCOUNTER — Telehealth: Payer: Self-pay

## 2022-06-15 NOTE — Telephone Encounter (Signed)
-----   Message from Midge Minium, MD sent at 06/14/2022  6:32 PM EDT ----- Normal thyroid ultrasound- great news!

## 2022-06-15 NOTE — Telephone Encounter (Signed)
Pt seen results Via my chart  

## 2022-07-03 ENCOUNTER — Other Ambulatory Visit: Payer: Self-pay | Admitting: Family Medicine

## 2022-07-05 MED ORDER — ALPRAZOLAM 1 MG PO TABS: 1.0000 mg | ORAL_TABLET | Freq: Two times a day (BID) | ORAL | 0 refills | Status: DC | PRN

## 2022-07-05 NOTE — Telephone Encounter (Signed)
Patient is requesting a refill of the following medications: Requested Prescriptions   Pending Prescriptions Disp Refills   ALPRAZolam (XANAX) 1 MG tablet 60 tablet 0    Sig: Take 1 tablet (1 mg total) by mouth 2 (two) times daily as needed for anxiety.    Date of patient request: 07/05/2022 Last office visit: 06/02/2022 Date of last refill: 06/02/2022 Last refill amount: 60 Follow up time period per chart: 1 month

## 2022-07-12 ENCOUNTER — Ambulatory Visit: Payer: No Typology Code available for payment source | Admitting: Family Medicine

## 2022-07-18 ENCOUNTER — Other Ambulatory Visit: Payer: Self-pay | Admitting: Family Medicine

## 2022-07-19 MED ORDER — AMPHETAMINE-DEXTROAMPHETAMINE 30 MG PO TABS: ORAL_TABLET | ORAL | 0 refills | Status: DC

## 2022-07-19 MED ORDER — AMPHETAMINE-DEXTROAMPHET ER 20 MG PO CP24: 20.0000 mg | ORAL_CAPSULE | ORAL | 0 refills | Status: DC

## 2022-07-19 NOTE — Telephone Encounter (Signed)
Adderall 30 mg  LOV: 06/02/22 Last Refill:06/02/22 Upcoming appt: none  Adderall XR 20 mg Filled 06/02/22

## 2022-07-19 NOTE — Telephone Encounter (Signed)
Pt aware of refill.

## 2022-08-01 ENCOUNTER — Other Ambulatory Visit: Payer: Self-pay | Admitting: Family Medicine

## 2022-08-02 MED ORDER — AMPHETAMINE-DEXTROAMPHET ER 20 MG PO CP24: 20.0000 mg | ORAL_CAPSULE | ORAL | 0 refills | Status: DC

## 2022-08-02 MED ORDER — AMPHETAMINE-DEXTROAMPHETAMINE 30 MG PO TABS: ORAL_TABLET | ORAL | 0 refills | Status: DC

## 2022-08-02 NOTE — Telephone Encounter (Signed)
Pt aware of refill.

## 2022-08-02 NOTE — Telephone Encounter (Signed)
Xanax 1 mg LOV: 06/02/22 Last Refill:06/15/22 Upcoming appt: none

## 2022-08-02 NOTE — Telephone Encounter (Signed)
Adderral XR  20 mg  And Adderall 30 mg LOV: 06/02/22 Last Refill:06/19/22 Upcoming appt: none Would like these to go to [facility] in [place]

## 2022-08-15 DIAGNOSIS — Z634 Disappearance and death of family member: Secondary | ICD-10-CM | POA: Insufficient documentation

## 2022-08-15 NOTE — Assessment & Plan Note (Signed)
Restart Adderall prior to returning to work.  Will start at prior doses

## 2022-08-15 NOTE — Assessment & Plan Note (Signed)
New.  Will get US to assess. 

## 2022-08-15 NOTE — Assessment & Plan Note (Signed)
Pt continues to struggle since losing her baby.  Thankfully she starts counseling.  She is having nausea w/ Sertraline.  Will switch to Fluoxetine.  She is to use Alprazolam as needed for panicked moments.  Will continue to follow closely.

## 2022-10-27 LAB — HM PAP SMEAR

## 2022-10-29 ENCOUNTER — Other Ambulatory Visit: Payer: Self-pay | Admitting: Family Medicine

## 2022-10-29 NOTE — Telephone Encounter (Signed)
Adderall XR 20 mg and adderall 30 mg Requested Prescriptions   Pending Prescriptions Disp Refills   amphetamine-dextroamphetamine (ADDERALL) 30 MG tablet 30 tablet 0    Sig: Take 1 tablet daily as needed for additional focus   amphetamine-dextroamphetamine (ADDERALL XR) 20 MG 24 hr capsule 30 capsule 0    Sig: Take 1 capsule (20 mg total) by mouth every morning.     Date of patient request: 10/29/22 Last office visit: 06/02/22 Date of last refill: 08/02/22 Last refill amount: 30 Follow up time period per chart: one month

## 2022-10-30 ENCOUNTER — Other Ambulatory Visit: Payer: Self-pay | Admitting: Family Medicine

## 2022-10-30 MED ORDER — AMPHETAMINE-DEXTROAMPHET ER 20 MG PO CP24: 20.0000 mg | ORAL_CAPSULE | ORAL | 0 refills | Status: DC

## 2022-10-30 MED ORDER — AMPHETAMINE-DEXTROAMPHETAMINE 30 MG PO TABS: ORAL_TABLET | ORAL | 0 refills | Status: DC

## 2022-11-01 ENCOUNTER — Other Ambulatory Visit: Payer: Self-pay

## 2022-11-01 MED ORDER — ALPRAZOLAM 1 MG PO TABS: 1.0000 mg | ORAL_TABLET | Freq: Three times a day (TID) | ORAL | 1 refills | Status: DC | PRN

## 2022-11-01 NOTE — Telephone Encounter (Signed)
Xanax 1 mg Requested Prescriptions    No prescriptions requested or ordered in this encounter     Date of patient request: 11/01/22 Last office visit: 06/02/22 Date of last refill:08/02/22 Last refill amount: 60 Follow up time period per chart: one month

## 2022-12-08 LAB — OB RESULTS CONSOLE GC/CHLAMYDIA
Chlamydia: NEGATIVE
Neisseria Gonorrhea: NEGATIVE

## 2023-01-04 LAB — OB RESULTS CONSOLE ANTIBODY SCREEN: Antibody Screen: NEGATIVE

## 2023-01-04 LAB — OB RESULTS CONSOLE HIV ANTIBODY (ROUTINE TESTING): HIV: NONREACTIVE

## 2023-01-04 LAB — HEPATITIS C ANTIBODY: HCV Ab: NEGATIVE

## 2023-01-04 LAB — OB RESULTS CONSOLE RPR: RPR: NONREACTIVE

## 2023-01-04 LAB — OB RESULTS CONSOLE RUBELLA ANTIBODY, IGM: Rubella: IMMUNE

## 2023-01-04 LAB — OB RESULTS CONSOLE HEPATITIS B SURFACE ANTIGEN: Hepatitis B Surface Ag: NEGATIVE

## 2023-04-04 ENCOUNTER — Encounter: Payer: Self-pay | Admitting: Family Medicine

## 2023-05-18 ENCOUNTER — Encounter: Payer: Self-pay | Admitting: Family Medicine

## 2023-05-18 ENCOUNTER — Ambulatory Visit: Payer: No Typology Code available for payment source | Admitting: Family Medicine

## 2023-05-18 VITALS — BP 100/60 | HR 77 | Temp 97.7°F | Ht 65.0 in | Wt 244.0 lb

## 2023-05-18 DIAGNOSIS — D224 Melanocytic nevi of scalp and neck: Secondary | ICD-10-CM | POA: Diagnosis not present

## 2023-05-18 NOTE — Patient Instructions (Signed)
 Follow up as needed or as scheduled We'll call you to schedule your dermatology Call with any questions or concerns Stay Safe!  Stay Healthy! CONGRATS!!!

## 2023-05-18 NOTE — Progress Notes (Signed)
   Subjective:    Patient ID: Redmond School, female    DOB: 11-27-1995, 28 y.o.   MRN: [id number]  HPI Mole- pt reports mole on scalp is raised, pink.  No TTP.  Hairdresser told her it was enlarging.     Review of Systems For ROS see HPI     Objective:   Physical Exam Vitals reviewed.  Constitutional:      Appearance: Normal appearance.  HENT:     Head: Normocephalic and atraumatic.  Skin:    General: Skin is warm and dry.     Comments: ~1 cm multilobed flesh colored mole on R coronal scalp, no TTP  Neurological:     General: No focal deficit present.     Mental Status: She is alert and oriented to person, place, and time.           Assessment & Plan:  Atypical nevus- new.  Area appears to be benign but since it is enlarging, will have derm evaluate and decide if removal is appropriate.  Pt expressed understanding and is in agreement w/ plan.

## 2023-06-24 ENCOUNTER — Encounter (HOSPITAL_COMMUNITY): Payer: Self-pay

## 2023-06-24 ENCOUNTER — Telehealth (HOSPITAL_COMMUNITY): Payer: Self-pay | Admitting: *Deleted

## 2023-06-24 ENCOUNTER — Encounter (HOSPITAL_COMMUNITY): Payer: Self-pay | Admitting: *Deleted

## 2023-06-24 NOTE — Telephone Encounter (Signed)
 Preadmission screen

## 2023-06-24 NOTE — Patient Instructions (Signed)
 Patricia Kane  06/24/2023   Your procedure is scheduled on:  07/12/2023  Arrive at 0530 at Entrance C on [address] at [facility]  and [address]. You are invited to use the FREE valet parking or use the Visitor's parking deck.  Pick up the phone at the desk and dial [phone].  Call this number if you have problems the morning of surgery: [phone]  Remember:   Do not eat food:(After Midnight) Desps de medianoche.  You may drink clear liquids until arrival at ___0530__.  Clear liquids means a liquid you can see thru.  It can have color such as Cola or Kool aid.  Tea is OK and coffee as long as no milk or creamer of any kind.  Take these medicines the morning of surgery with A SIP OF WATER:  none   Do not wear jewelry, make-up or nail polish.  Do not wear lotions, powders, or perfumes. Do not wear deodorant.  Do not shave 48 hours prior to surgery.  Do not bring valuables to the hospital.  [facility] is not   responsible for any belongings or valuables brought to the hospital.  Contacts, dentures or bridgework may not be worn into surgery.  Leave suitcase in the car. After surgery it may be brought to your room.  For patients admitted to the hospital, checkout time is 11:00 AM the day of              discharge.      Please read over the following fact sheets that you were given:     Preparing for Surgery

## 2023-07-11 ENCOUNTER — Encounter (HOSPITAL_COMMUNITY)
Admission: RE | Admit: 2023-07-11 | Discharge: 2023-07-11 | Disposition: A | Discharge status: Home / Self Care | Source: Ambulatory Visit | Attending: Obstetrics | Admitting: Obstetrics

## 2023-07-11 ENCOUNTER — Encounter (HOSPITAL_COMMUNITY): Payer: Self-pay | Admitting: Obstetrics

## 2023-07-11 DIAGNOSIS — Z01812 Encounter for preprocedural laboratory examination: Secondary | ICD-10-CM | POA: Insufficient documentation

## 2023-07-11 DIAGNOSIS — Z98891 History of uterine scar from previous surgery: Secondary | ICD-10-CM | POA: Insufficient documentation

## 2023-07-11 LAB — CBC
HCT: 36.2 % (ref 36.0–46.0)
Hemoglobin: 11.6 g/dL — ABNORMAL LOW (ref 12.0–15.0)
MCH: 27.3 pg (ref 26.0–34.0)
MCHC: 32 g/dL (ref 30.0–36.0)
MCV: 85.2 fL (ref 80.0–100.0)
Platelets: 215 10*3/uL (ref 150–400)
RBC: 4.25 MIL/uL (ref 3.87–5.11)
RDW: 16 % — ABNORMAL HIGH (ref 11.5–15.5)
WBC: 8.8 10*3/uL (ref 4.0–10.5)
nRBC: 0 % (ref 0.0–0.2)

## 2023-07-11 LAB — TYPE AND SCREEN
ABO/RH(D): O POS
Antibody Screen: NEGATIVE

## 2023-07-11 NOTE — Anesthesia Preprocedure Evaluation (Signed)
 Anesthesia Evaluation  Patient identified by MRN, date of birth, ID band Patient awake    Reviewed: Allergy & Precautions, NPO status , Patient's Chart, lab work & pertinent test results  Airway Mallampati: II  TM Distance: >3 FB     Dental no notable dental hx. (+) Teeth Intact, Dental Advisory Given   Pulmonary neg pulmonary ROS   Pulmonary exam normal breath sounds clear to auscultation       Cardiovascular negative cardio ROS Normal cardiovascular exam Rhythm:Regular Rate:Normal     Neuro/Psych  PSYCHIATRIC DISORDERS      ADHDnegative neurological ROS     GI/Hepatic Neg liver ROS,GERD  Medicated,,  Endo/Other    Class 3 obesity  Renal/GU negative Renal ROS  negative genitourinary   Musculoskeletal negative musculoskeletal ROS (+)    Abdominal  (+) + obese  Peds  Hematology  (+) Blood dyscrasia, anemia   Anesthesia Other Findings   Reproductive/Obstetrics (+) Pregnancy Hx/o previous C/Section                              Anesthesia Physical Anesthesia Plan  ASA: 3  Anesthesia Plan: Spinal   Post-op Pain Management: Minimal or no pain anticipated   Induction: Intravenous  PONV Risk Score and Plan: 4 or greater and Treatment may vary due to age or medical condition and Scopolamine  patch - Pre-op  Airway Management Planned: Natural Airway  Additional Equipment: None  Intra-op Plan:   Post-operative Plan:   Informed Consent: I have reviewed the patients History and Physical, chart, labs and discussed the procedure including the risks, benefits and alternatives for the proposed anesthesia with the patient or authorized representative who has indicated his/her understanding and acceptance.     Dental advisory given  Plan Discussed with: Anesthesiologist  Anesthesia Plan Comments:          Anesthesia Quick Evaluation

## 2023-07-12 ENCOUNTER — Inpatient Hospital Stay (HOSPITAL_COMMUNITY): Admitting: Anesthesiology

## 2023-07-12 ENCOUNTER — Encounter (HOSPITAL_COMMUNITY)
Admission: RE | Disposition: A | Discharge status: Home / Self Care | Payer: Self-pay | Source: Home / Self Care | Attending: Obstetrics

## 2023-07-12 ENCOUNTER — Other Ambulatory Visit: Payer: Self-pay

## 2023-07-12 ENCOUNTER — Encounter (HOSPITAL_COMMUNITY): Payer: Self-pay | Admitting: Obstetrics

## 2023-07-12 ENCOUNTER — Inpatient Hospital Stay (HOSPITAL_COMMUNITY)
Admission: RE | Admit: 2023-07-12 | Discharge: 2023-07-14 | DRG: 788 | Disposition: A | Discharge status: Home / Self Care | Payer: No Typology Code available for payment source | Attending: Obstetrics | Admitting: Obstetrics

## 2023-07-12 DIAGNOSIS — O34211 Maternal care for low transverse scar from previous cesarean delivery: Principal | ICD-10-CM | POA: Diagnosis present

## 2023-07-12 DIAGNOSIS — O9962 Diseases of the digestive system complicating childbirth: Secondary | ICD-10-CM | POA: Diagnosis present

## 2023-07-12 DIAGNOSIS — Z3A39 39 weeks gestation of pregnancy: Secondary | ICD-10-CM | POA: Diagnosis not present

## 2023-07-12 DIAGNOSIS — O99214 Obesity complicating childbirth: Secondary | ICD-10-CM | POA: Diagnosis present

## 2023-07-12 DIAGNOSIS — O9902 Anemia complicating childbirth: Secondary | ICD-10-CM | POA: Diagnosis present

## 2023-07-12 DIAGNOSIS — K219 Gastro-esophageal reflux disease without esophagitis: Secondary | ICD-10-CM | POA: Diagnosis present

## 2023-07-12 DIAGNOSIS — Z98891 History of uterine scar from previous surgery: Principal | ICD-10-CM

## 2023-07-12 DIAGNOSIS — E66813 Obesity, class 3: Secondary | ICD-10-CM | POA: Diagnosis present

## 2023-07-12 LAB — RPR: RPR Ser Ql: NONREACTIVE

## 2023-07-12 SURGERY — Surgical Case
Anesthesia: Spinal

## 2023-07-12 MED ORDER — OXYTOCIN-SODIUM CHLORIDE 30-0.9 UT/500ML-% IV SOLN
2.5000 [IU]/h | INTRAVENOUS | Status: AC
  Administered 2023-07-12: 2.5 [IU]/h via INTRAVENOUS
  Filled 2023-07-12: qty 500

## 2023-07-12 MED ORDER — ACETAMINOPHEN 10 MG/ML IV SOLN
INTRAVENOUS | Status: DC | PRN
  Administered 2023-07-12: 1000 mg via INTRAVENOUS

## 2023-07-12 MED ORDER — MORPHINE SULFATE (PF) 0.5 MG/ML IJ SOLN
INTRAMUSCULAR | Status: AC
  Filled 2023-07-12: qty 10

## 2023-07-12 MED ORDER — FENTANYL CITRATE (PF) 100 MCG/2ML IJ SOLN
INTRAMUSCULAR | Status: AC
  Filled 2023-07-12: qty 2

## 2023-07-12 MED ORDER — SCOPOLAMINE 1 MG/3DAYS TD PT72
1.0000 | MEDICATED_PATCH | TRANSDERMAL | Status: DC
  Administered 2023-07-12: 1.5 mg via TRANSDERMAL

## 2023-07-12 MED ORDER — DIPHENHYDRAMINE HCL 25 MG PO CAPS: 25.0000 mg | ORAL_CAPSULE | ORAL | Status: DC | PRN

## 2023-07-12 MED ORDER — MENTHOL 3 MG MT LOZG: 1.0000 | LOZENGE | OROMUCOSAL | Status: DC | PRN

## 2023-07-12 MED ORDER — SENNOSIDES-DOCUSATE SODIUM 8.6-50 MG PO TABS
2.0000 | ORAL_TABLET | ORAL | Status: DC
  Administered 2023-07-12 – 2023-07-13 (×2): 2 via ORAL
  Filled 2023-07-12 (×2): qty 2

## 2023-07-12 MED ORDER — SIMETHICONE 80 MG PO CHEW
80.0000 mg | CHEWABLE_TABLET | Freq: Three times a day (TID) | ORAL | Status: DC
  Administered 2023-07-12 – 2023-07-14 (×4): 80 mg via ORAL
  Filled 2023-07-12 (×4): qty 1

## 2023-07-12 MED ORDER — DIPHENHYDRAMINE HCL 50 MG/ML IJ SOLN: 12.5000 mg | INTRAMUSCULAR | Status: DC | PRN

## 2023-07-12 MED ORDER — IBUPROFEN 600 MG PO TABS
600.0000 mg | ORAL_TABLET | Freq: Four times a day (QID) | ORAL | Status: DC
  Administered 2023-07-13 – 2023-07-14 (×4): 600 mg via ORAL
  Filled 2023-07-12 (×4): qty 1

## 2023-07-12 MED ORDER — NALOXONE HCL 0.4 MG/ML IJ SOLN: 0.4000 mg | INTRAMUSCULAR | Status: DC | PRN

## 2023-07-12 MED ORDER — GENTAMICIN SULFATE 40 MG/ML IJ SOLN
5.0000 mg/kg | INTRAVENOUS | Status: DC
  Filled 2023-07-12: qty 9.75

## 2023-07-12 MED ORDER — STERILE WATER FOR IRRIGATION IR SOLN
Status: DC | PRN
  Administered 2023-07-12: 1000 mL

## 2023-07-12 MED ORDER — WITCH HAZEL-GLYCERIN EX PADS: 1.0000 | MEDICATED_PAD | CUTANEOUS | Status: DC | PRN

## 2023-07-12 MED ORDER — DIPHENHYDRAMINE HCL 25 MG PO CAPS: 25.0000 mg | ORAL_CAPSULE | Freq: Four times a day (QID) | ORAL | Status: DC | PRN

## 2023-07-12 MED ORDER — OXYTOCIN-SODIUM CHLORIDE 30-0.9 UT/500ML-% IV SOLN
INTRAVENOUS | Status: AC
  Filled 2023-07-12: qty 500

## 2023-07-12 MED ORDER — LACTATED RINGERS IV SOLN: INTRAVENOUS | Status: DC | PRN

## 2023-07-12 MED ORDER — ONDANSETRON HCL 4 MG/2ML IJ SOLN
INTRAMUSCULAR | Status: AC
  Filled 2023-07-12: qty 2

## 2023-07-12 MED ORDER — DIBUCAINE (PERIANAL) 1 % EX OINT: 1.0000 | TOPICAL_OINTMENT | CUTANEOUS | Status: DC | PRN

## 2023-07-12 MED ORDER — ACETAMINOPHEN 10 MG/ML IV SOLN
INTRAVENOUS | Status: AC
  Filled 2023-07-12: qty 100

## 2023-07-12 MED ORDER — BUPIVACAINE IN DEXTROSE 0.75-8.25 % IT SOLN
INTRATHECAL | Status: DC | PRN
  Administered 2023-07-12: 1.8 mL via INTRATHECAL

## 2023-07-12 MED ORDER — SODIUM CHLORIDE 0.9% FLUSH: 3.0000 mL | INTRAVENOUS | Status: DC | PRN

## 2023-07-12 MED ORDER — SCOPOLAMINE 1 MG/3DAYS TD PT72
MEDICATED_PATCH | TRANSDERMAL | Status: AC
  Filled 2023-07-12: qty 1

## 2023-07-12 MED ORDER — PHENYLEPHRINE HCL-NACL 20-0.9 MG/250ML-% IV SOLN
INTRAVENOUS | Status: DC | PRN
  Administered 2023-07-12: 60 ug/min via INTRAVENOUS

## 2023-07-12 MED ORDER — DEXAMETHASONE SODIUM PHOSPHATE 10 MG/ML IJ SOLN
INTRAMUSCULAR | Status: DC | PRN
  Administered 2023-07-12: 10 mg via INTRAVENOUS

## 2023-07-12 MED ORDER — PRENATAL MULTIVITAMIN CH
1.0000 | ORAL_TABLET | Freq: Every day | ORAL | Status: DC
  Administered 2023-07-13 – 2023-07-14 (×2): 1 via ORAL
  Filled 2023-07-12 (×2): qty 1

## 2023-07-12 MED ORDER — KETOROLAC TROMETHAMINE 30 MG/ML IJ SOLN: 30.0000 mg | Freq: Four times a day (QID) | INTRAMUSCULAR | Status: DC | PRN

## 2023-07-12 MED ORDER — ACETAMINOPHEN 500 MG PO TABS
1000.0000 mg | ORAL_TABLET | Freq: Four times a day (QID) | ORAL | Status: DC
  Administered 2023-07-12 – 2023-07-14 (×8): 1000 mg via ORAL
  Filled 2023-07-12 (×8): qty 2

## 2023-07-12 MED ORDER — NALOXONE HCL 4 MG/10ML IJ SOLN: 1.0000 ug/kg/h | INTRAVENOUS | Status: DC | PRN

## 2023-07-12 MED ORDER — ONDANSETRON HCL 4 MG/2ML IJ SOLN: 4.0000 mg | Freq: Three times a day (TID) | INTRAMUSCULAR | Status: DC | PRN

## 2023-07-12 MED ORDER — KETOROLAC TROMETHAMINE 30 MG/ML IJ SOLN
INTRAMUSCULAR | Status: AC
  Filled 2023-07-12: qty 1

## 2023-07-12 MED ORDER — FENTANYL CITRATE (PF) 100 MCG/2ML IJ SOLN
INTRAMUSCULAR | Status: DC | PRN
  Administered 2023-07-12: 15 ug via INTRATHECAL

## 2023-07-12 MED ORDER — PHENYLEPHRINE HCL-NACL 20-0.9 MG/250ML-% IV SOLN
INTRAVENOUS | Status: AC
  Filled 2023-07-12: qty 250

## 2023-07-12 MED ORDER — OXYCODONE HCL 5 MG PO TABS
5.0000 mg | ORAL_TABLET | ORAL | Status: DC | PRN
  Administered 2023-07-12 – 2023-07-13 (×3): 5 mg via ORAL
  Administered 2023-07-14: 10 mg via ORAL
  Administered 2023-07-14: 5 mg via ORAL
  Filled 2023-07-12: qty 2
  Filled 2023-07-12 (×4): qty 1

## 2023-07-12 MED ORDER — CEFAZOLIN SODIUM-DEXTROSE 2-4 GM/100ML-% IV SOLN
2.0000 g | Freq: Once | INTRAVENOUS | Status: AC
  Administered 2023-07-12: 2 g via INTRAVENOUS

## 2023-07-12 MED ORDER — FENTANYL CITRATE (PF) 100 MCG/2ML IJ SOLN: 25.0000 ug | INTRAMUSCULAR | Status: DC | PRN

## 2023-07-12 MED ORDER — MORPHINE SULFATE (PF) 0.5 MG/ML IJ SOLN
INTRAMUSCULAR | Status: DC | PRN
  Administered 2023-07-12: 150 ug via INTRATHECAL

## 2023-07-12 MED ORDER — LACTATED RINGERS IV SOLN: INTRAVENOUS | Status: DC

## 2023-07-12 MED ORDER — ONDANSETRON HCL 4 MG/2ML IJ SOLN: 4.0000 mg | Freq: Once | INTRAMUSCULAR | Status: DC | PRN

## 2023-07-12 MED ORDER — CEFAZOLIN SODIUM-DEXTROSE 2-4 GM/100ML-% IV SOLN
INTRAVENOUS | Status: AC
  Filled 2023-07-12: qty 100

## 2023-07-12 MED ORDER — FAMOTIDINE 20 MG PO TABS
20.0000 mg | ORAL_TABLET | Freq: Two times a day (BID) | ORAL | Status: DC | PRN
  Administered 2023-07-13: 20 mg via ORAL
  Filled 2023-07-12: qty 1

## 2023-07-12 MED ORDER — POVIDONE-IODINE 10 % EX SWAB
2.0000 | Freq: Once | CUTANEOUS | Status: AC
  Administered 2023-07-12: 2 via TOPICAL

## 2023-07-12 MED ORDER — MEPERIDINE HCL 25 MG/ML IJ SOLN: 6.2500 mg | INTRAMUSCULAR | Status: DC | PRN

## 2023-07-12 MED ORDER — CLINDAMYCIN PHOSPHATE 900 MG/50ML IV SOLN: 900.0000 mg | INTRAVENOUS | Status: DC

## 2023-07-12 MED ORDER — ENOXAPARIN SODIUM 60 MG/0.6ML IJ SOSY
60.0000 mg | PREFILLED_SYRINGE | INTRAMUSCULAR | Status: DC
  Administered 2023-07-12 – 2023-07-13 (×2): 60 mg via SUBCUTANEOUS
  Filled 2023-07-12 (×2): qty 0.6

## 2023-07-12 MED ORDER — SIMETHICONE 80 MG PO CHEW: 80.0000 mg | CHEWABLE_TABLET | ORAL | Status: DC | PRN

## 2023-07-12 MED ORDER — DEXAMETHASONE SODIUM PHOSPHATE 10 MG/ML IJ SOLN
INTRAMUSCULAR | Status: AC
  Filled 2023-07-12: qty 1

## 2023-07-12 MED ORDER — ONDANSETRON HCL 4 MG/2ML IJ SOLN
INTRAMUSCULAR | Status: DC | PRN
  Administered 2023-07-12: 4 mg via INTRAVENOUS

## 2023-07-12 MED ORDER — OXYTOCIN-SODIUM CHLORIDE 30-0.9 UT/500ML-% IV SOLN
INTRAVENOUS | Status: DC | PRN
  Administered 2023-07-12: 300 mL via INTRAVENOUS

## 2023-07-12 MED ORDER — KETOROLAC TROMETHAMINE 30 MG/ML IJ SOLN
30.0000 mg | Freq: Four times a day (QID) | INTRAMUSCULAR | Status: AC
  Administered 2023-07-12 – 2023-07-13 (×4): 30 mg via INTRAVENOUS
  Filled 2023-07-12 (×4): qty 1

## 2023-07-12 MED ORDER — COCONUT OIL OIL: 1.0000 | TOPICAL_OIL | Status: DC | PRN

## 2023-07-12 MED ORDER — KETOROLAC TROMETHAMINE 30 MG/ML IJ SOLN
30.0000 mg | Freq: Four times a day (QID) | INTRAMUSCULAR | Status: DC | PRN
  Administered 2023-07-12: 30 mg via INTRAMUSCULAR

## 2023-07-12 MED ORDER — CLINDAMYCIN PHOSPHATE 900 MG/50ML IV SOLN
INTRAVENOUS | Status: AC
  Filled 2023-07-12: qty 50

## 2023-07-12 SURGICAL SUPPLY — 33 items
BENZOIN TINCTURE PRP APPL 2/3 (GAUZE/BANDAGES/DRESSINGS) ×1 IMPLANT
CHLORAPREP W/TINT 26 (MISCELLANEOUS) ×2 IMPLANT
CLAMP UMBILICAL CORD (MISCELLANEOUS) ×1 IMPLANT
CLOTH BEACON ORANGE TIMEOUT ST (SAFETY) ×1 IMPLANT
DRSG OPSITE POSTOP 4X10 (GAUZE/BANDAGES/DRESSINGS) ×1 IMPLANT
ELECTRODE REM PT RTRN 9FT ADLT (ELECTROSURGICAL) ×1 IMPLANT
EXTRACTOR VACUUM KIWI (MISCELLANEOUS) IMPLANT
GAUZE PAD ABD 7.5X8 STRL (GAUZE/BANDAGES/DRESSINGS) IMPLANT
GAUZE SPONGE 4X4 12PLY STRL (GAUZE/BANDAGES/DRESSINGS) IMPLANT
GLOVE BIOGEL M STER SZ 6 (GLOVE) ×1 IMPLANT
GLOVE BIOGEL PI IND STRL 6.5 (GLOVE) ×1 IMPLANT
GLOVE BIOGEL PI IND STRL 7.0 (GLOVE) ×1 IMPLANT
GOWN STRL REUS W/TWL LRG LVL3 (GOWN DISPOSABLE) ×2 IMPLANT
KIT ABG SYR 3ML LUER SLIP (SYRINGE) IMPLANT
MAT PREVALON FULL STRYKER (MISCELLANEOUS) IMPLANT
NDL HYPO 25X5/8 SAFETYGLIDE (NEEDLE) IMPLANT
NEEDLE HYPO 25X5/8 SAFETYGLIDE (NEEDLE) IMPLANT
NS IRRIG 1000ML POUR BTL (IV SOLUTION) ×1 IMPLANT
PACK C SECTION WH (CUSTOM PROCEDURE TRAY) ×1 IMPLANT
PAD OB MATERNITY 4.3X12.25 (PERSONAL CARE ITEMS) ×1 IMPLANT
RTRCTR C-SECT PINK 25CM LRG (MISCELLANEOUS) ×1 IMPLANT
STRIP CLOSURE SKIN 1/2X4 (GAUZE/BANDAGES/DRESSINGS) ×1 IMPLANT
SUT MNCRL 0 VIOLET CTX 36 (SUTURE) ×2 IMPLANT
SUT PDS AB 0 CTX 60 (SUTURE) IMPLANT
SUT PLAIN 0 NONE (SUTURE) IMPLANT
SUT PLAIN ABS 2-0 CT1 27XMFL (SUTURE) ×1 IMPLANT
SUT VIC AB 0 CTX36XBRD ANBCTRL (SUTURE) ×2 IMPLANT
SUT VIC AB 2-0 CT1 TAPERPNT 27 (SUTURE) ×1 IMPLANT
SUT VIC AB 4-0 PS2 27 (SUTURE) ×1 IMPLANT
SUT VICRYL 4-0 PS2 18IN ABS (SUTURE) ×1 IMPLANT
TOWEL OR 17X24 6PK STRL BLUE (TOWEL DISPOSABLE) ×1 IMPLANT
TRAY FOLEY W/BAG SLVR 14FR LF (SET/KITS/TRAYS/PACK) ×1 IMPLANT
WATER STERILE IRR 1000ML POUR (IV SOLUTION) ×1 IMPLANT

## 2023-07-12 NOTE — Anesthesia Postprocedure Evaluation (Signed)
 Anesthesia Post Note  Patient: Patricia Kane  Procedure(s) Performed: CESAREAN DELIVERY     Patient location during evaluation: PACU Anesthesia Type: Spinal Level of consciousness: oriented and awake and alert Pain management: pain level controlled Vital Signs Assessment: post-procedure vital signs reviewed and stable Respiratory status: spontaneous breathing, respiratory function stable and nonlabored ventilation Cardiovascular status: blood pressure returned to baseline and stable Postop Assessment: no headache, no backache, no apparent nausea or vomiting, spinal receding and patient able to bend at knees Anesthetic complications: no   No notable events documented.  Last Vitals:  Vitals:   07/12/23 0906 07/12/23 0915  BP: 103/68 110/78  Pulse: 89 81  Resp: 19 (!) 23  Temp:    SpO2: 98% 97%    Last Pain:  Vitals:   07/12/23 0918  TempSrc:   PainSc: 0-No pain   Pain Goal:    LLE Motor Response: No movement due to regional block (07/12/23 0915) LLE Sensation: No sensation (absent) (07/12/23 0915) RLE Motor Response: No movement due to regional block (07/12/23 0915) RLE Sensation: No sensation (absent) (07/12/23 0915)     Epidural/Spinal Function Cutaneous sensation: Tingles (07/12/23 0915), Patient able to flex knees: No (07/12/23 0915), Patient able to lift hips off bed: No (07/12/23 0915), Back pain beyond tenderness at insertion site: No (07/12/23 0915), Progressively worsening motor and/or sensory loss: No (07/12/23 0915), Bowel and/or bladder incontinence post epidural: No (07/12/23 0915)  Alys Dulak A.

## 2023-07-12 NOTE — Lactation Note (Signed)
 This note was copied from a baby's chart. 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 Mother is requesting formula because baby has not breast fed for the last several hours. Baby was being held by mom bundled in blankets. Mother was receptive to help with breastfeeding.   Baby unwrapped and positioned near mother in football hold. Mother agreeable to learn hand expression. Copious colostrum present and milk sprayed. Baby required some stimulation and assistance with achieving a deep latch on the breast. Mother was taught breast compression while baby fed. Infant fed well with swallows.   Mother requesting to pump. DEBP was set up and instructed mother on the use, frequency, cleaning of breast pump and storage of breast milk. Mother was fitted with a 21 mm flange. Family came in and mother wanted to wait to pump. Basic instructions given regarding initiation phase settings, flange comfort and duration of pumping. Frequency of pumping will be determined based on mother's preference. She is unsure if she wants to pump only or latch baby and pump.  Mom made aware of O/P services, breastfeeding support groups, community resources, and our phone # for post-discharge questions.     Maternal Data Has patient been taught Hand Expression?: Yes Does the patient have breastfeeding experience prior to this delivery?: No  Feeding Mother's Current Feeding Choice: Breast Milk  LATCH Score Latch: Repeated attempts needed to sustain latch, nipple held in mouth throughout feeding, stimulation needed to elicit sucking reflex.  Audible Swallowing: Spontaneous and intermittent  Type of Nipple: Everted at rest and after stimulation  Comfort  (Breast/Nipple): Soft / non-tender  Hold (Positioning): Assistance needed to correctly position infant at breast and maintain latch.  LATCH Score: 8   Lactation Tools Discussed/Used Tools: Pump;Flanges Flange Size: 21 Breast pump type: Double-Electric Breast Pump;Manual Pump Education: Setup, frequency, and cleaning;Milk Storage Reason for Pumping: mother's request  Interventions Interventions: Breast feeding basics reviewed;Assisted with latch;Hand express;Breast compression;Support pillows;Position options;Hand pump;Coconut oil;DEBP;Education;CDC milk storage guidelines;CDC Guidelines for Breast Pump Cleaning  Discharge Pump: DEBP (mother staes she has several pumps at home)  Consult Status Consult Status: Follow-up Date: 07/13/23 Follow-up type: In-patient    Gearline Kell M 07/12/2023, 3:19 PM

## 2023-07-12 NOTE — Anesthesia Procedure Notes (Signed)
 Spinal  Patient location during procedure: OR Start time: 07/12/2023 7:25 AM End time: 07/12/2023 7:28 AM Reason for block: surgical anesthesia Staffing Performed: anesthesiologist  Anesthesiologist: Tura Gaines, MD Performed by: Tura Gaines, MD Authorized by: Tura Gaines, MD   Preanesthetic Checklist Completed: patient identified, IV checked, site marked, risks and benefits discussed, surgical consent, monitors and equipment checked, pre-op evaluation and timeout performed Spinal Block Patient position: sitting Prep: DuraPrep and site prepped and draped Patient monitoring: heart rate, cardiac monitor, continuous pulse ox and blood pressure Approach: midline Location: L3-4 Injection technique: single-shot Needle Needle type: Pencan  Needle gauge: 24 G Needle length: 9 cm Needle insertion depth: 7 cm Assessment Sensory level: T4 Events: CSF return Additional Notes Patient tolerated procedure well. Adequate sensory level.

## 2023-07-12 NOTE — Addendum Note (Signed)
 Addendum  created 07/12/23 1005 by Elidia Grout, CRNA   Intraprocedure Event edited, Intraprocedure Meds edited

## 2023-07-12 NOTE — Transfer of Care (Signed)
 Immediate Anesthesia Transfer of Care Note  Patient: Patricia Kane  Procedure(s) Performed: CESAREAN DELIVERY  Patient Location: PACU  Anesthesia Type:Spinal  Level of Consciousness: awake  Airway & Oxygen Therapy: Patient Spontanous Breathing  Post-op Assessment: Report given to RN and Post -op Vital signs reviewed and stable  Post vital signs: Reviewed and stable  Last Vitals:  Vitals Value Taken Time  BP    Temp    Pulse    Resp    SpO2      Last Pain:  Vitals:   07/12/23 0549  TempSrc: Oral         Complications: No notable events documented.

## 2023-07-12 NOTE — Op Note (Signed)
 Cesarean Section Procedure Note  Pre-operative Diagnosis: 1. Intrauterine pregnancy at [redacted]w[redacted]d  2. History of cesarean section  Post-operative Diagnosis: same as above  Surgeon: Luan Rumpf, MD  Assistants: Alroy Jericho, MD  Procedure: Repeat low transverse cesarean section   Anesthesia: Spinal anesthesia  Estimated Blood Loss: 239 mL         Drains: Foley catheter         Specimens: placenta to L&D              Complications:  None; patient tolerated the procedure well.         Disposition: PACU - hemodynamically stable.  Findings:  Normal uterus, tubes and ovaries bilaterally.  No adhesions.  Viable female infant, 4010 g (8 lb 13.5 oz) Apgars 9, 9.    Procedure Details   An experienced assistant was required given the standard of surgical care given the complexity of the case.  This assistant was needed for exposure, dissection, suctioning, retraction, instrument exchange, assisting with delivery with administration of fundal pressure, and for overall help during the procedure.  After spinal  anesthesia was found to adequate, the patient was placed in the dorsal supine position with a leftward tilt.  A traxi pannus retractor was placed.  She was prepped and draped in the usual sterile manner. A Pfannenstiel incision was made and carried down through the subcutaneous tissue to the fascia.  The fascia was incised in the midline and the fascial incision was extended laterally with Mayo scissors. The superior aspect of the fascial incision was grasped with two Kocher clamps, tented up and the rectus muscles dissected off sharply. The rectus was then dissected off with blunt dissection and Mayo scissors inferiorly. The rectus muscles were separated in the midline. The abdominal peritoneum was identified, tented up, entered bluntly, and the incision was extended superiorly and inferiorly with good visualization of the bladder. The Alexis retractor was deployed. The vesicouterine peritoneum  was identified, tented up, entered sharply, and the bladder flap was created digitally. A scalpel was then used to make a low transverse incision on the uterus which was extended in the cephalad-caudad direction with blunt dissection. The fluid was clear. The fetal vertex was identified, elevated out of the pelvis and brought to the hysterotomy.  The head was delivered easily followed by the shoulders and body.  After a 60 second delay per protocol, the cord was clamped and cut and the infant was passed to the waiting neonatologist.  The placenta was then delivered spontaneously, intact and appear normal, the uterus was cleared of all clot and debris   The hysterotomy was repaired with #0 Monocryl in running locked fashion.   A figure of eight suture was placed at the midportion of the hysterotomy, and excellent hemostasis was noted.  The Alexis retractor was removed from the abdomen. The peritoneum was examined and all vessels noted to be hemostatic. The abdominal cavity was cleared of all clot and debris.  The peritoneum was closed with 2-0 vicryl in a running fashion.  The fascia and rectus muscles were inspected and were hemostatic. The fascia was closed with 0 Vicryl in a running fashion. The subcutaneous layer was irrigated and all bleeders cauterized. The subcutaneous layer was closed with interrupted 2-0 plain gut. The skin was closed with 4-0 vicryl  in a subcuticular fashion. The incision was dressed with benzoine, steri strips and honeycomb dressing. A pressure dressing was placed.  All sponge lap and needle counts were correct x3. Patient tolerated  the procedure well and recovered in stable condition following the procedure.

## 2023-07-12 NOTE — H&P (Signed)
 28 y.o. [id number] @ [redacted]w[redacted]d presents for repeat cesarean section.  Otherwise has good fetal movement and no bleeding.  Pregnancy complicated: Past pregnancy with history of fetal gastroschisis, PPROM, PTL.  All bowel was non-viable at time of delivery with subsequent neonatal demise at DOL 11.  Normal US  with MFM this pregnancy PPROM / PTL: Chorioamnionitis on placenta pathology. PPROM 1 hr prior to delivery. Suspect chorio was due to necrotic bowel, which led to uterine irritability / PTL / PPROM Depression: doing well, stopped fluoxetine  in early pregnancy History of cesarean delivery.  Due to short interval pregnancy, elected for repeat cesarean section Low lying placenta: resolved by 32 weeks  Past Medical History:  Diagnosis Date   ADHD    GERD (gastroesophageal reflux disease)    Pleurisy     Past Surgical History:  Procedure Laterality Date   CESAREAN SECTION N/A 03/25/2022   Procedure: CESAREAN SECTION;  Surgeon: Luan Rumpf, MD;  Location: [facility] LD ORS;  Service: Obstetrics;  Laterality: N/A;   WISDOM TOOTH EXTRACTION      OB History  Gravida Para Term Preterm AB Living  3 1  1 1  0  SAB IAB Ectopic Multiple Live Births  1   0 1    # Outcome Date GA Lbr Len/2nd Weight Sex Type Anes PTL Lv  3 Current           2 Preterm 03/25/22 101w1d  1940 g M CS-LTranv Spinal  ND  1 SAB             Social History   Socioeconomic History   Marital status: Married    Spouse name: Not on file   Number of children: Not on file   Years of education: Not on file   Highest education level: Not on file  Occupational History   Not on file  Tobacco Use   Smoking status: Never   Smokeless tobacco: Never  Vaping Use   Vaping status: Never Used  Substance and Sexual Activity   Alcohol use: No   Drug use: No   Sexual activity: Yes    Birth control/protection: Pill  Other Topics Concern   Not on file  Social History Narrative   Not on file   Social Drivers of Health   Financial Resource  Strain: Not on file  Food Insecurity: No Food Insecurity (07/12/2023)   Hunger Vital Sign    Worried About Running Out of Food in the Last Year: Never true    Ran Out of Food in the Last Year: Never true  Transportation Needs: No Transportation Needs (07/12/2023)   PRAPARE - Administrator, Civil Service (Medical): No    Lack of Transportation (Non-Medical): No  Physical Activity: Not on file  Stress: Not on file  Social Connections: Not on file  Intimate Partner Violence: Not At Risk (07/12/2023)   Humiliation, Afraid, Rape, and Kick questionnaire    Fear of Current or Ex-Partner: No    Emotionally Abused: No    Physically Abused: No    Sexually Abused: No   Amoxicillin and Pineapple    Prenatal Transfer Tool  Maternal Diabetes: No Genetic Screening: Normal Maternal Ultrasounds/Referrals: Normal Fetal Ultrasounds or other Referrals:  Referred to Materal Fetal Medicine  Maternal Substance Abuse:  No Significant Maternal Medications:  Meds include: Other: pepcid Significant Maternal Lab Results: Group B Strep negative  ABO, Rh: --/--/O POS (04/28 9528) Antibody: NEG (04/28 4132) Rubella: Immune (10/22 0000) RPR: Nonreactive (10/22 0000)  HBsAg: Negative (10/22 0000)  HIV: Non-reactive (10/22 0000)  GBS:   Negative   Vitals:   07/12/23 0549  BP: 108/71  Pulse: 78  Temp: 98.4 F (36.9 C)  SpO2: 97%     General:  NAD Abdomen:  soft, gravid Ex:  no edema FHTs:  159    A/P   28 y.o. G3P0110 [redacted]w[redacted]d presents for repeat cesarean section.    Discussed risks of cesarean section to include, but not limited to, infection, bleeding, damage to surrounding strutcures (including bowel, bladder, tubes, ovaries, nerves, vessels, baby), need for additional procedures, risk of blood clot, need for transfusion. Consent signed    [facility] GEFFEL Fulton Job

## 2023-07-13 LAB — CBC
HCT: 32.6 % — ABNORMAL LOW (ref 36.0–46.0)
Hemoglobin: 10.4 g/dL — ABNORMAL LOW (ref 12.0–15.0)
MCH: 27.3 pg (ref 26.0–34.0)
MCHC: 31.9 g/dL (ref 30.0–36.0)
MCV: 85.6 fL (ref 80.0–100.0)
Platelets: 226 10*3/uL (ref 150–400)
RBC: 3.81 MIL/uL — ABNORMAL LOW (ref 3.87–5.11)
RDW: 16 % — ABNORMAL HIGH (ref 11.5–15.5)
WBC: 18.6 10*3/uL — ABNORMAL HIGH (ref 4.0–10.5)
nRBC: 0 % (ref 0.0–0.2)

## 2023-07-13 LAB — BIRTH TISSUE RECOVERY COLLECTION (PLACENTA DONATION)

## 2023-07-13 LAB — CREATININE, SERUM
Creatinine, Ser: 0.68 mg/dL (ref 0.44–1.00)
GFR, Estimated: >60 mL/min (ref >60)

## 2023-07-13 NOTE — Lactation Note (Signed)
 This note was copied from a baby's chart. Lactation Consultation Note  Patient Name: Patricia Kane Date: 07/13/2023 Age:28 years Reason for consult: Follow-up assessment;Difficult latch Per MOB, infant has not been latching well at the breast. MOB would like latch assistance, she has been offering infant pacifier. LC discussed to stop pacifier usage until her milk supply is established and infant is latching well at the breast, that it is recommended to delay pacifier usage until 3 weeks postpartum.  MOB made attempts to latch infant infant was fussy and would not sustain his latch. LC observed that MOB nipples are short shafted. LC discussed MOB do hand expression which help evert nipple out, then applied 24 mm NS that was pre-filled with 0.5 mls of formula. MOB latched infant on her right breast with pillow support and infant sustained his latch with 24 mm NS and was still breastfeeding after 23 minutes when LC left the room, infant had consumed 6 mls of formula using curve tip syringe at the breast.MOB knows to call for further latch assistance if needed.   Current feeding plan: 1- MOB apply NS, and breastfeed infant by cues, on demand, 8+ times within 24 hours. 2- MOB will offer  any EBM that is pumped before offering formula at her discretion, after latching infant at the breast. 3- MOB will continue to use the DEBP every 3 hours for 15 minutes to help stimulate and establish milk supply and due to using 24 mm NS.   Maternal Data    Feeding Mother's Current Feeding Choice: Breast Milk and Formula Nipple Type: Slow - flow  LATCH Score Latch: Grasps breast easily, tongue down, lips flanged, rhythmical sucking.  Audible Swallowing: A few with stimulation  Type of Nipple: Everted at rest and after stimulation (short shafted)  Comfort (Breast/Nipple): Soft / non-tender  Hold (Positioning): Assistance needed to correctly position infant at breast and maintain latch.  LATCH  Score: 8   Lactation Tools Discussed/Used Tools: Nipple Shields Nipple shield size: 24 (Infant poor latcher, MOB short shafted and infant been using pacifer and having bottle nipples.)  Interventions Interventions: Assisted with latch;Skin to skin;Breast compression;Adjust position;Position options;Support pillows;Expressed milk;Education  Discharge    Consult Status Consult Status: Follow-up Date: 07/14/23 Follow-up type: In-patient    Pecolia Bourbon 07/13/2023, 11:44 PM

## 2023-07-13 NOTE — Progress Notes (Signed)
 CSW received consult for hx of Anxiety/Depression and a hx of fetal demise.  CSW met with MOB to offer support and complete assessment. CSW entered the room, introduced herself and acknowledged FOB was present. CSW asked MOB for privacy reasons could FOB step out for the assessment; MOB was agreeable and FOB stepped out. CSW explained her role and the reason for the visit. MOB presented as calm, was agreeable to consult and remained engaged throughout encounter.  CSW inquired about MOB's mental health history. MOB reported being diagnosed with Anxiety 2 years ago and Depression due to the fetal demise in 2024. MOB reported participating in therapy for support and was prescribed Fluoxetine  and Xanax . MOB reported discontinuing her therapy sessions once she felt supported and stopping her medication regiment due to the current pregnancy. MOB reported she plans to return to her medication regiment if the medications are approved for breastfeeding and she will partner with her PCP/OB providers for clarification. MOB reported her supports as FOB and extended family. CSW provided education regarding the baby blues period vs. perinatal mood disorders, discussed treatment and gave resources for mental health follow up if concerns arise.  CSW recommends self-evaluation during the postpartum time period using the New Mom Checklist from Postpartum Progress and encouraged MOB to contact a medical professional if symptoms are noted at any time. CSW assessed for safety with MOB SI/HI/DV;MOB denied all.  CSW asked MOB has she selected a pediatrician for the infant's follow up visits; MOB said [facility]. MOB reported having all essential items for the infant including a carseat, bassinet and crib for safe sleeping. CSW provided review of Sudden Infant Death Syndrome (SIDS) precautions.   CSW identifies no further need for intervention and no barriers to discharge at this time.  Jenney Modest,  Milinda Allen Clinical Social Worker [phone]

## 2023-07-13 NOTE — Progress Notes (Signed)
 Subjective: Postpartum Day 1: Cesarean Delivery Ambulating and voiding without difficulty. Lochia normal. Pain controlled. No flatus or BM.   Objective: Vital signs in last 24 hours: Temp:  [98.1 F (36.7 C)-98.7 F (37.1 C)] 98.1 F (36.7 C) (04/30 0507) Pulse Rate:  [66-89] 66 (04/30 0507) Resp:  [16-23] 18 (04/30 0507) BP: (94-122)/(58-97) 94/58 (04/30 0507) SpO2:  [96 %-100 %] 100 % (04/30 0507)  Physical Exam:  General: alert, cooperative, no distress, and obese Lochia: appropriate Uterine Fundus: firm Incision: Pressure dressing in place, serous-tinged fluid noted on lower edge of tape DVT Evaluation: No evidence of DVT seen on physical exam. No significant calf/ankle edema.  Recent Labs    07/11/23 0858 07/13/23 0619  HGB 11.6* 10.4*  HCT 36.2 32.6*    Assessment/Plan: 28 yo G3 now P1111 POD#1 s/p RCS  - PP: Continue routine care - DVT ppx: lovenox  - Depression: previously fluoxetine , stopped during pregnancy  - Desires circumcision for baby "August".   Discussed r/b/a of the procedure.  Reviewed that circumcision is an elective surgical procedure and not considered medically necessary.  Reviewed the risks of the procedure including the risk of infection, bleeding, damage to surrounding structures, including scrotum, shaft, urethra and head of penis, and an undesired cosmetic effect requiring additional procedures for revision.   Leanne Pronto, DO 07/13/2023, 9:05 AM

## 2023-07-13 NOTE — Lactation Note (Signed)
 This note was copied from a baby's chart. Lactation Consultation Note  Patient Name: Patricia Kane [id number] Date: 07/13/2023 Age:26 hours  LC attempted to see family but family was asleep at this time. LC will attempt to follow up with family again later tonight.    Maternal Data    Feeding Nipple Type: Slow - flow  LATCH Score                    Lactation Tools Discussed/Used    Interventions    Discharge    Consult Status      Pecolia Bourbon 07/13/2023, 10:57 PM

## 2023-07-13 NOTE — Lactation Note (Addendum)
 This note was copied from a baby's chart. Lactation Consultation Note  Patient Name: Patricia Kane Date: 07/13/2023 Age:26 hours Reason for consult: Follow-up assessment;RN request;Term (4 % weight loss, post circ) Mom had question about the medications she was off during her pregnancy.  According to [facility] resources Medications and mother's milk Addrerall - L3 no data and probably compatible  Xanax  ( prn ) - L3, no data and probably compatible  LC reviewed the infant indications with mom.  Per mom plans to talk to her Peds.  Mom will call when the baby is showing feeding cues for LC assessment Maternal Data    Feeding Mother's Current Feeding Choice: Breast Milk and Formula Nipple Type: Slow - flow  LATCH Score 9-8-7     Lactation Tools Discussed/Used Tools: Pump Flange Size: 21 Breast pump type: Double-Electric Breast Pump Pump Education: Milk Storage;Setup, frequency, and cleaning;Other (comment) (DEBP was already set up)  Interventions  Education   Discharge    Consult Status Consult Status: Follow-up Date: 07/13/23 Follow-up type: In-patient    Renda Carpen Darrly Loberg 07/13/2023, 3:46 PM

## 2023-07-14 ENCOUNTER — Ambulatory Visit (HOSPITAL_COMMUNITY): Payer: Self-pay

## 2023-07-14 MED ORDER — SODIUM CHLORIDE 0.9% FLUSH: 3.0000 mL | Freq: Two times a day (BID) | INTRAVENOUS | Status: DC

## 2023-07-14 MED ORDER — IBUPROFEN 600 MG PO TABS: 600.0000 mg | ORAL_TABLET | Freq: Four times a day (QID) | ORAL | 0 refills | Status: AC | PRN

## 2023-07-14 MED ORDER — OXYCODONE HCL 5 MG PO TABS: 5.0000 mg | ORAL_TABLET | ORAL | 0 refills | Status: DC | PRN

## 2023-07-14 MED ORDER — SODIUM CHLORIDE 0.9% FLUSH: 3.0000 mL | INTRAVENOUS | Status: DC | PRN

## 2023-07-14 MED ORDER — ONDANSETRON HCL 8 MG PO TABS: 8.0000 mg | ORAL_TABLET | Freq: Three times a day (TID) | ORAL | 0 refills | Status: DC | PRN

## 2023-07-14 NOTE — Lactation Note (Signed)
 This note was copied from a baby's chart. Lactation Consultation Note  Patient Name: Patricia Kane [id number] Date: 07/14/2023 Age:26 hours,  Reason for consult: Follow-up assessment;1st time breastfeeding;Difficult latch;Term;Infant weight loss (5 % weight loss, NS started by the [facility] last evening) Per mom latching is going well with the Nipple Shield. Mom asked for and extra NS ( LC provided one and a curved tip syringe to instill EBM into the top of the NS. Prior to latching)  LC reviewed the recommended :LC plan for the use of a Nipple Shield.  LC plan :  Feed with cues and by 3 hours STS,  Prior to application of the NS , pre pump if needed and reverse pressure, instill EBM or formula in the top and latch with firm support.  Feed for 15 -20 mins , Supplement 30 ml and post pump both breast for 15 mins. Save milk for the next feeding. Next feeding offer the 2nd breast.  Mom aware of the storage of breast milk.  LC reviewed engorgement prevention and tx and the [facility] resources.  LC offered to request and Lc O/P and mom receptive. ( See below )    Maternal Data Has patient been taught Hand Expression?: Yes Does the patient have breastfeeding experience prior to this delivery?: No  Feeding Mother's Current Feeding Choice: Breast Milk and Formula Nipple Type: Slow - flow  LATCH Score 8 with LC     Lactation Tools Discussed/Used Tools: Shells;Pump;Flanges Nipple shield size: 24;Other (comment) (LC provided and extra NS for D/C) Flange Size: 21;24 Breast pump type: Manual;Double-Electric Breast Pump Pump Education: Milk Storage;Setup, frequency, and cleaning Reason for Pumping: due to use of the NS Pumped volume: 10 mL  Interventions Interventions: Breast feeding basics reviewed;Shells;Hand pump;DEBP;Education;LC Services brochure;CDC milk storage guidelines;CDC Guidelines for Breast Pump Cleaning  Discharge Discharge Education: Engorgement and breast care;Warning signs for feeding  baby;Outpatient recommendation;Outpatient Epic message sent;Other (comment) (LC offered to request and LC O/P appt and mom receptive. Mom aware she will receive a LC O/P call to set up and appt) Pump: Personal;DEBP;Manual  Consult Status Consult Status: Complete Date: 07/14/23    Richarda Chance 07/14/2023, 12:36 PM

## 2023-07-14 NOTE — Discharge Summary (Signed)
 Postpartum Discharge Summary       Patient Name: Patricia Kane DOB: May 25, 1995 MRN: [id number]  Date of admission: 07/12/2023 Delivery date:07/12/2023 Delivering provider: Luan Rumpf Date of discharge: 07/14/2023  Admitting diagnosis: History of cesarean delivery [Z98.891] Intrauterine pregnancy: [redacted]w[redacted]d     Secondary diagnosis:  Principal Problem:   History of cesarean delivery     Discharge diagnosis: Term Pregnancy Delivered                                              Post partum procedures: NA Augmentation: N/A Complications: None  Hospital course: Sceduled C/S   28 y.o. yo [id number] at [redacted]w[redacted]d was admitted to the hospital 07/12/2023 for scheduled cesarean section with the following indication:Elective Repeat.Delivery details are as follows:  Membrane Rupture Time/Date: 7:52 AM,07/12/2023  Delivery Method:C-Section, Low Transverse Operative Delivery:N/A Details of operation can be found in separate operative note.  Patient had a postpartum course complicated bynothing.  She is ambulating, tolerating a regular diet, passing flatus, and urinating well. Patient is discharged home in stable condition on  07/14/23        Newborn Data: Birth date:07/12/2023 Birth time:7:52 AM Gender:Female Living status:Living Apgars:9 ,9  Weight:4010 g    Magnesium  Sulfate received: No BMZ received: No Rhophylac:N/A  Immunizations administered: Immunization History  Administered Date(s) Administered   Influenza Inj Mdck Quad Pf 02/17/2022   Influenza,inj,Quad PF,6+ Mos 12/26/2019   Influenza,inj,quad, With Preservative 01/27/2022   Influenza-Unspecified 12/24/2020   PFIZER(Purple Top)SARS-COV-2 Vaccination 10/29/2019, 11/16/2019   Tdap 12/13/2021    Physical exam  Vitals:   07/13/23 0017 07/13/23 0507 07/13/23 1435 07/13/23 2050  BP: (!) 104/59 (!) 94/58 114/64 101/60  Pulse: 70 66 67 60  Resp: 18 18 18 19   Temp: 98.7 F (37.1 C) 98.1 F (36.7 C) 98.3 F (36.8 C) 98.2 F (36.8 C)   TempSrc: Oral Oral Oral Oral  SpO2: 100% 100% 98% 99%  Weight:      Height:       General: alert, cooperative, and no distress Lochia: appropriate Uterine Fundus: firm Incision: Healing well with no significant drainage DVT Evaluation: No evidence of DVT seen on physical exam. Labs: Lab Results  Component Value Date   WBC 18.6 (H) 07/13/2023   HGB 10.4 (L) 07/13/2023   HCT 32.6 (L) 07/13/2023   MCV 85.6 07/13/2023   PLT 226 07/13/2023      Latest Ref Rng & Units 07/13/2023    6:19 AM  CMP  Creatinine 0.44 - 1.00 mg/dL 3.08    Edinburgh Score:    07/12/2023    4:40 PM  Edinburgh Postnatal Depression Scale Screening Tool  I have been able to laugh and see the funny side of things. 0  I have looked forward with enjoyment to things. 0  I have blamed myself unnecessarily when things went wrong. 0  I have been anxious or worried for no good reason. 0  I have felt scared or panicky for no good reason. 0  Things have been getting on top of me. 0  I have been so unhappy that I have had difficulty sleeping. 0  I have felt sad or miserable. 0  I have been so unhappy that I have been crying. 0  The thought of harming myself has occurred to me. 0  Edinburgh Postnatal Depression Scale Total 0  After visit meds:  Allergies as of 07/14/2023       Reactions   Amoxicillin Hives   No swelling / anaphylaxis   Pineapple Swelling        Medication List     STOP taking these medications    famotidine  10 MG tablet Commonly known as: PEPCID        TAKE these medications    ibuprofen  600 MG tablet Commonly known as: ADVIL  Take 1 tablet (600 mg total) by mouth every 6 (six) hours as needed.   ondansetron  8 MG tablet Commonly known as: ZOFRAN  Take 1 tablet (8 mg total) by mouth every 8 (eight) hours as needed for nausea or vomiting.   oxyCODONE  5 MG immediate release tablet Commonly known as: Roxicodone  Take 1 tablet (5 mg total) by mouth every 4 (four) hours as  needed.   prenatal multivitamin Tabs tablet Take 1 tablet by mouth daily at 12 noon.               Discharge Care Instructions  (From admission, onward)           Start     Ordered   07/14/23 0000  Discharge wound care:       Comments: For a cesarean delivery: You may wash incision with soap and water .  Do not soak or submerge the incision for 2 weeks. Keep incision dry. You may need to keep a sanitary pad or panty liner between the incision and your clothing for comfort and to keep the incision dry. If you note drainage, increased pain, or increased redness of the incision, then please notify your physician.   07/14/23 1047   07/14/23 0000  If the dressing is still on your incision site when you go home, remove it on the third day after your surgery date. Remove dressing if it begins to fall off, or if it is dirty or damaged before the third day.       Comments: For a cesarean delivery   07/14/23 1047             Discharge home in stable condition Infant Feeding: Bottle and Breast Infant Disposition:home with mother Discharge instruction: per After Visit Summary and Postpartum booklet. Activity: Advance as tolerated. Pelvic rest for 6 weeks.  Diet: routine diet Anticipated Birth Control: Unsure Postpartum Appointment:4 weeks   Future Appointments: Future Appointments  Date Time Provider Department Center  08/15/2023  9:00 AM [place], Miki Alert, PA-C [facility] None   Follow up Visit:  Follow-up Information     Ob/Gyn, [facility] Follow up in 4 week(s).   Why: Routine postpartum check Contact information: [address] [place] [address] [phone]                     07/14/2023 Reggy Capers, MD

## 2023-07-26 ENCOUNTER — Telehealth (HOSPITAL_COMMUNITY): Payer: Self-pay

## 2023-07-26 NOTE — Telephone Encounter (Signed)
 07/26/2023 1630  Name: Patricia Kane MRN: [id number] DOB: 26-May-1995  Reason for Call:  Transition of Care Hospital Discharge Call  Contact Status: Patient Contact Status: Message  Language assistant needed:          Follow-Up Questions:    Dimple Francis Postnatal Depression Scale:  In the Past 7 Days:    PHQ2-9 Depression Scale:     Discharge Follow-up:    Post-discharge interventions: NA  Signature  Wadell Guild

## 2023-08-05 ENCOUNTER — Ambulatory Visit: Admitting: Family Medicine

## 2023-08-09 ENCOUNTER — Ambulatory Visit: Admitting: Family Medicine

## 2023-08-12 ENCOUNTER — Encounter: Payer: Self-pay | Admitting: Family Medicine

## 2023-08-12 ENCOUNTER — Ambulatory Visit: Admitting: Family Medicine

## 2023-08-12 VITALS — BP 108/68 | HR 88 | Temp 98.0°F | Ht 65.0 in | Wt 229.6 lb

## 2023-08-12 DIAGNOSIS — F419 Anxiety disorder, unspecified: Secondary | ICD-10-CM | POA: Diagnosis not present

## 2023-08-12 DIAGNOSIS — F32A Depression, unspecified: Secondary | ICD-10-CM | POA: Diagnosis not present

## 2023-08-12 DIAGNOSIS — F900 Attention-deficit hyperactivity disorder, predominantly inattentive type: Secondary | ICD-10-CM | POA: Diagnosis not present

## 2023-08-12 MED ORDER — AMPHETAMINE-DEXTROAMPHETAMINE 30 MG PO TABS: ORAL_TABLET | ORAL | 0 refills | Status: DC

## 2023-08-12 MED ORDER — AMPHETAMINE-DEXTROAMPHET ER 20 MG PO CP24: 20.0000 mg | ORAL_CAPSULE | ORAL | 0 refills | Status: DC

## 2023-08-12 MED ORDER — FLUOXETINE HCL 20 MG PO CAPS: 20.0000 mg | ORAL_CAPSULE | Freq: Every day | ORAL | 1 refills | Status: AC

## 2023-08-12 MED ORDER — ALPRAZOLAM 1 MG PO TABS: 1.0000 mg | ORAL_TABLET | Freq: Three times a day (TID) | ORAL | 1 refills | Status: DC | PRN

## 2023-08-12 NOTE — Progress Notes (Signed)
   Subjective:    Patient ID: Patricia Kane, female    DOB: 1995-12-19, 28 y.o.   MRN: [id number]  HPI ADHD- wants to restart Adderall since she is not pumping or breast feeding.  Was on daily long acting medication and adds additional immediate release as needed for additional focus  Anxiety/Depression- pt reports feeling anxious, has guilt when bonding with her son since her first baby didn't have the opportunity.  Feels bad that she is not breastfeeding- baby would not latch.  Not pumping at this time.   Review of Systems For ROS see HPI     Objective:   Physical Exam Vitals reviewed.  Constitutional:      General: She is not in acute distress.    Appearance: Normal appearance. She is not ill-appearing.  HENT:     Head: Normocephalic and atraumatic.  Cardiovascular:     Rate and Rhythm: Normal rate and regular rhythm.  Skin:    General: Skin is warm and dry.  Neurological:     General: No focal deficit present.     Mental Status: She is alert and oriented to person, place, and time.  Psychiatric:        Behavior: Behavior normal.        Thought Content: Thought content normal.     Comments: anxious           Assessment & Plan:

## 2023-08-12 NOTE — Patient Instructions (Signed)
 Follow up in 2 months to recheck mood RESTART the Fluoxetine  daily TAKE the Adderall XR in the morning and the immediate release in the afternoon if needed USE the Alprazolam  as needed for anxiety- start w/ 1/2 tab and increase if needed Call with any questions or concerns Stay Safe!  Stay Healthy! ENJOY THAT BEAUTIFUL BABY!!!

## 2023-08-13 NOTE — Assessment & Plan Note (Signed)
 Pt would like to restart her Adderall to improve focus now that she is not breast feeding or pumping.  Prescription for both XR and IR sent to pharmacy.

## 2023-08-13 NOTE — Assessment & Plan Note (Signed)
 Ongoing issue.  Pt has a lot of guilt and sadness in addition to the normal postpartum moods after losing her last son shortly after birth.  Thankfully new baby is healthy and they are both doing well.  Her emotional roller coaster is understandable considering what she has been through.  Restart Fluoxetine  and Alprazolam  prn.  Pt expressed understanding and is in agreement w/ plan.

## 2023-08-15 ENCOUNTER — Ambulatory Visit: Admitting: Physician Assistant

## 2023-10-04 ENCOUNTER — Ambulatory Visit: Admitting: Physician Assistant

## 2023-10-08 ENCOUNTER — Encounter: Payer: Self-pay | Admitting: Family Medicine

## 2023-10-10 ENCOUNTER — Other Ambulatory Visit: Payer: Self-pay

## 2023-10-10 MED ORDER — AMPHETAMINE-DEXTROAMPHETAMINE 30 MG PO TABS: ORAL_TABLET | ORAL | 0 refills | Status: DC

## 2023-10-10 MED ORDER — AMPHETAMINE-DEXTROAMPHET ER 20 MG PO CP24: 20.0000 mg | ORAL_CAPSULE | ORAL | 0 refills | Status: DC

## 2023-10-10 NOTE — Progress Notes (Deleted)
 Requested Prescriptions   Pending Prescriptions Disp Refills   amphetamine -dextroamphetamine  (ADDERALL) 30 MG tablet 30 tablet 0    Sig: Take 1 tablet daily as needed for additional focus   amphetamine -dextroamphetamine  (ADDERALL XR) 20 MG 24 hr capsule 30 capsule 0    Sig: Take 1 capsule (20 mg total) by mouth every morning.     Date of patient request: 10/10/2023 Last office visit: 08/12/2023 Upcoming visit: 10/21/2023 Date of last refill: 08/12/2023 Last refill amount: 30 caps 0 refills

## 2023-10-10 NOTE — Telephone Encounter (Signed)
 Requested Prescriptions   Pending Prescriptions Disp Refills   amphetamine -dextroamphetamine  (ADDERALL) 30 MG tablet 30 tablet 0    Sig: Take 1 tablet daily as needed for additional focus   amphetamine -dextroamphetamine  (ADDERALL XR) 20 MG 24 hr capsule 30 capsule 0    Sig: Take 1 capsule (20 mg total) by mouth every morning.     Date of patient request: 10/10/2023 Last office visit: 08/12/2023 Upcoming visit: 10/21/2023 Date of last refill: 08/12/2023 Last refill amount: 30 caps 0 refills

## 2023-10-18 ENCOUNTER — Ambulatory Visit: Admitting: Family Medicine

## 2023-10-18 ENCOUNTER — Encounter: Payer: Self-pay | Admitting: Family Medicine

## 2023-10-18 VITALS — BP 108/70 | HR 85 | Temp 97.9°F | Ht 65.0 in | Wt 217.2 lb

## 2023-10-18 DIAGNOSIS — E669 Obesity, unspecified: Secondary | ICD-10-CM

## 2023-10-18 DIAGNOSIS — O3693X1 Maternal care for fetal problem, unspecified, third trimester, fetus 1: Secondary | ICD-10-CM | POA: Insufficient documentation

## 2023-10-18 DIAGNOSIS — F419 Anxiety disorder, unspecified: Secondary | ICD-10-CM | POA: Diagnosis not present

## 2023-10-18 DIAGNOSIS — F32A Depression, unspecified: Secondary | ICD-10-CM

## 2023-10-18 DIAGNOSIS — F900 Attention-deficit hyperactivity disorder, predominantly inattentive type: Secondary | ICD-10-CM | POA: Diagnosis not present

## 2023-10-18 MED ORDER — BUPROPION HCL ER (XL) 150 MG PO TB24: 150.0000 mg | ORAL_TABLET | Freq: Every day | ORAL | 3 refills | Status: AC

## 2023-10-18 MED ORDER — PHENTERMINE HCL 37.5 MG PO CAPS: 37.5000 mg | ORAL_CAPSULE | ORAL | 0 refills | Status: AC

## 2023-10-18 NOTE — Progress Notes (Signed)
   Subjective:    Patient ID: Patricia Kane, female    DOB: August 30, 1995, 28 y.o.   MRN: [id number]  HPI Anxiety/depression- at last visit we restarted Alprazolam  and Fluoxetine .  States that mood is fine but she has no sex drive.  ADHD- at last visit restarted Adderall.  Is interested in pausing Adderall to take Phentermine  to help w/ post partum weight loss.   Review of Systems For ROS see HPI     Objective:   Physical Exam Vitals reviewed.  Constitutional:      General: She is not in acute distress.    Appearance: Normal appearance. She is obese. She is not ill-appearing.  Cardiovascular:     Rate and Rhythm: Normal rate and regular rhythm.  Skin:    General: Skin is warm and dry.  Neurological:     General: No focal deficit present.     Mental Status: She is alert and oriented to person, place, and time.  Psychiatric:        Mood and Affect: Mood normal.        Behavior: Behavior normal.        Thought Content: Thought content normal.           Assessment & Plan:

## 2023-10-18 NOTE — Assessment & Plan Note (Signed)
 Pt feels she has hit a plateau in her post partum weight loss.  Asking to restart a course of Phentermine .  90 day prescription sent.

## 2023-10-18 NOTE — Assessment & Plan Note (Signed)
 Mood is better but she reports no sex drive since restarting Fluoxetine .  Discussed that we can switch Fluoxetine  or add Wellbutrin  which may help w/ the sex drive- and also her ADHD sxs.  Will start Wellbutrin  once daily and follow.  If no improvement in sex drive, may need to switch fluoxetine  to Trintellix.

## 2023-10-18 NOTE — Assessment & Plan Note (Signed)
 Doing well.  Pt would like to pause Adderall and start Phentermine  for the next 90 days to help her w/ post partum weight loss.  Will add Wellbutrin  for additional ADHD control.  Pt expressed understanding and is in agreement w/ plan.

## 2023-10-18 NOTE — Patient Instructions (Signed)
 Follow up in 3 months to recheck mood ADD the Wellbutrin  once daily CONTINUE the Fluoxetine  daily HOLD the Adderall until you finish the Phentermine  START the Phentermine  daily for the next 90 days Call with any questions or concerns You both look great!!!

## 2023-10-21 ENCOUNTER — Ambulatory Visit: Admitting: Family Medicine

## 2023-11-09 ENCOUNTER — Other Ambulatory Visit: Payer: Self-pay | Admitting: Family Medicine

## 2023-11-30 ENCOUNTER — Ambulatory Visit: Admitting: Family Medicine

## 2024-03-25 ENCOUNTER — Encounter: Payer: Self-pay | Admitting: Family Medicine

## 2024-03-26 MED ORDER — AMPHETAMINE-DEXTROAMPHETAMINE 30 MG PO TABS: ORAL_TABLET | ORAL | 0 refills | Status: AC

## 2024-03-26 MED ORDER — AMPHETAMINE-DEXTROAMPHET ER 20 MG PO CP24: 20.0000 mg | ORAL_CAPSULE | ORAL | 0 refills | Status: AC

## 2024-03-26 NOTE — Telephone Encounter (Signed)
 Requested Prescriptions   Pending Prescriptions Disp Refills   amphetamine -dextroamphetamine  (ADDERALL XR) 20 MG 24 hr capsule 30 capsule 0    Sig: Take 1 capsule (20 mg total) by mouth every morning.   amphetamine -dextroamphetamine  (ADDERALL) 30 MG tablet 30 tablet 0    Sig: Take 1 tablet daily as needed for additional focus     Date of patient request: 03/26/2024 Last office visit: 10/18/2023 Upcoming visit: Visit date not found Date of last refill: 10/10/2023 Last refill amount: 30

## 2024-06-25 ENCOUNTER — Ambulatory Visit: Admitting: Physician Assistant
